# Patient Record
Sex: Male | Born: 1955 | Race: White | Hispanic: No | Marital: Married | Smoking: Never smoker
Health system: Southern US, Community
[De-identification: ages and names within clinical notes are randomized; demographics above are authoritative.]

## PROBLEM LIST (undated history)

## (undated) DIAGNOSIS — R011 Cardiac murmur, unspecified: Secondary | ICD-10-CM

## (undated) DIAGNOSIS — K219 Gastro-esophageal reflux disease without esophagitis: Secondary | ICD-10-CM

## (undated) DIAGNOSIS — E785 Hyperlipidemia, unspecified: Secondary | ICD-10-CM

## (undated) DIAGNOSIS — R0683 Snoring: Secondary | ICD-10-CM

## (undated) DIAGNOSIS — Z9889 Other specified postprocedural states: Secondary | ICD-10-CM

## (undated) DIAGNOSIS — G4730 Sleep apnea, unspecified: Secondary | ICD-10-CM

## (undated) DIAGNOSIS — M1990 Unspecified osteoarthritis, unspecified site: Secondary | ICD-10-CM

## (undated) DIAGNOSIS — I10 Essential (primary) hypertension: Secondary | ICD-10-CM

## (undated) DIAGNOSIS — R112 Nausea with vomiting, unspecified: Secondary | ICD-10-CM

## (undated) DIAGNOSIS — I2510 Atherosclerotic heart disease of native coronary artery without angina pectoris: Secondary | ICD-10-CM

## (undated) HISTORY — PX: APPENDECTOMY: SHX54

## (undated) HISTORY — PX: TRIGGER FINGER RELEASE: SHX641

## (undated) HISTORY — PX: SHOULDER ARTHROSCOPY: SHX128

## (undated) HISTORY — PX: I&D EXTREMITY: SHX5045

---

## 2000-04-30 ENCOUNTER — Ambulatory Visit (HOSPITAL_COMMUNITY): Admission: RE | Admit: 2000-04-30 | Discharge: 2000-04-30 | Payer: Self-pay | Admitting: Gastroenterology

## 2000-04-30 ENCOUNTER — Encounter (INDEPENDENT_AMBULATORY_CARE_PROVIDER_SITE_OTHER): Payer: Self-pay | Admitting: Specialist

## 2001-05-14 ENCOUNTER — Encounter: Admission: RE | Admit: 2001-05-14 | Discharge: 2001-05-14 | Payer: Self-pay | Admitting: Family Medicine

## 2004-10-30 ENCOUNTER — Ambulatory Visit: Payer: Self-pay | Admitting: Family Medicine

## 2004-11-24 HISTORY — PX: CORONARY ARTERY BYPASS GRAFT: SHX141

## 2005-04-22 ENCOUNTER — Inpatient Hospital Stay (HOSPITAL_COMMUNITY): Admission: AD | Admit: 2005-04-22 | Discharge: 2005-04-29 | Payer: Self-pay | Admitting: *Deleted

## 2005-04-24 ENCOUNTER — Encounter (INDEPENDENT_AMBULATORY_CARE_PROVIDER_SITE_OTHER): Payer: Self-pay | Admitting: *Deleted

## 2005-05-21 ENCOUNTER — Encounter: Admission: RE | Admit: 2005-05-21 | Discharge: 2005-05-21 | Payer: Self-pay | Admitting: *Deleted

## 2005-06-02 ENCOUNTER — Encounter (HOSPITAL_COMMUNITY): Admission: RE | Admit: 2005-06-02 | Discharge: 2005-08-31 | Payer: Self-pay | Admitting: *Deleted

## 2005-06-20 ENCOUNTER — Ambulatory Visit (HOSPITAL_COMMUNITY): Admission: RE | Admit: 2005-06-20 | Discharge: 2005-06-20 | Payer: Self-pay | Admitting: *Deleted

## 2005-09-05 ENCOUNTER — Encounter (HOSPITAL_COMMUNITY): Admission: RE | Admit: 2005-09-05 | Discharge: 2005-09-25 | Payer: Self-pay | Admitting: *Deleted

## 2006-06-14 IMAGING — CR DG CHEST 2V
2 series · 2 of 2 positions shown · non-contrast
Comparison: None.

CLINICAL DATA: Chest pain.
 [id number] VIEW:

[w chest lat]
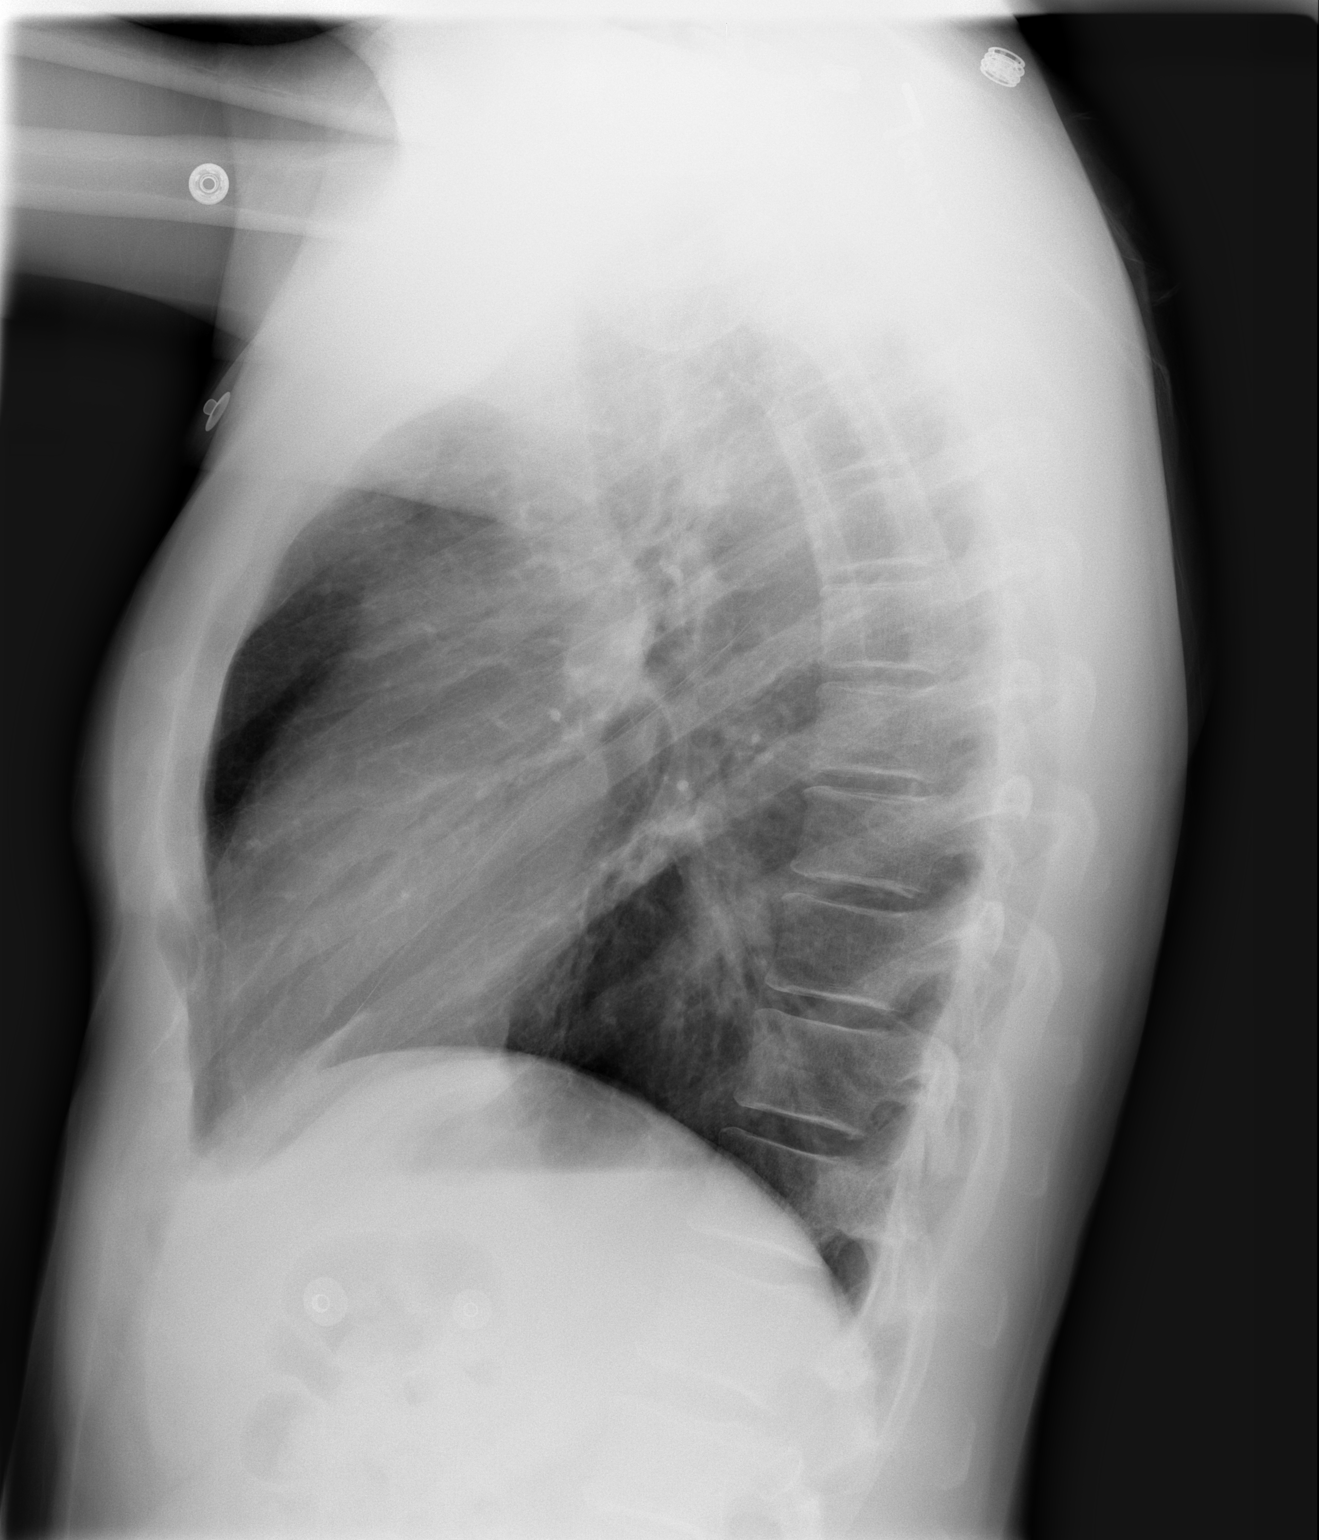

[w chest pa]
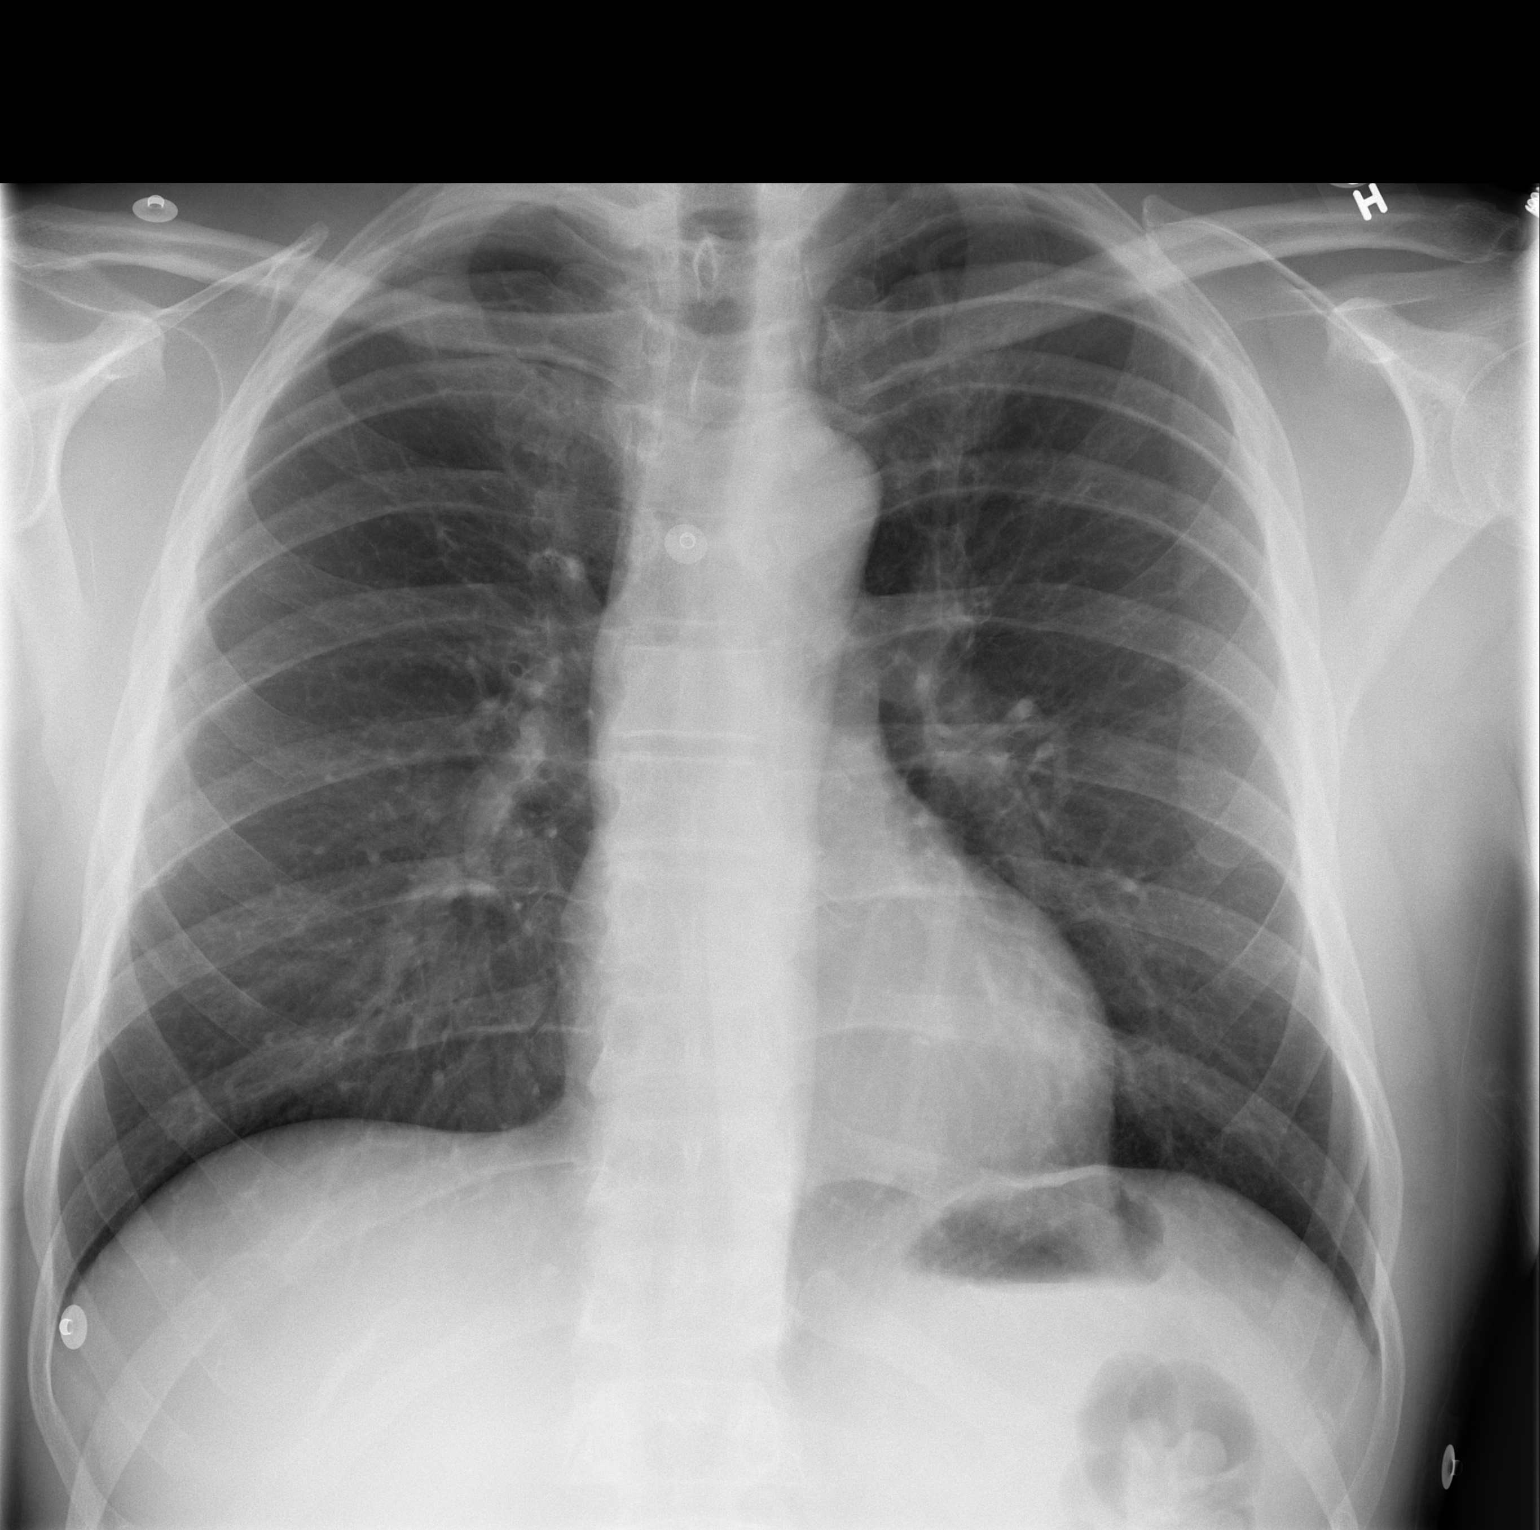

[2 of 2 positions shown; findings below may reference images not displayed]

The lungs are clear.  Cardiac and mediastinal contours are normal.  Osseous structures unremarkable.
IMPRESSION: Negative for acute cardiac or pulmonary process.

## 2006-06-17 IMAGING — CR DG CHEST 1V PORT
1 series · 1 of 1 positions shown · non-contrast
Comparison: 04/25/05.

CLINICAL DATA: Coronary artery disease, postop from coronary artery bypass grafting. 
 PORTABLE CHEST:

[view not recorded]
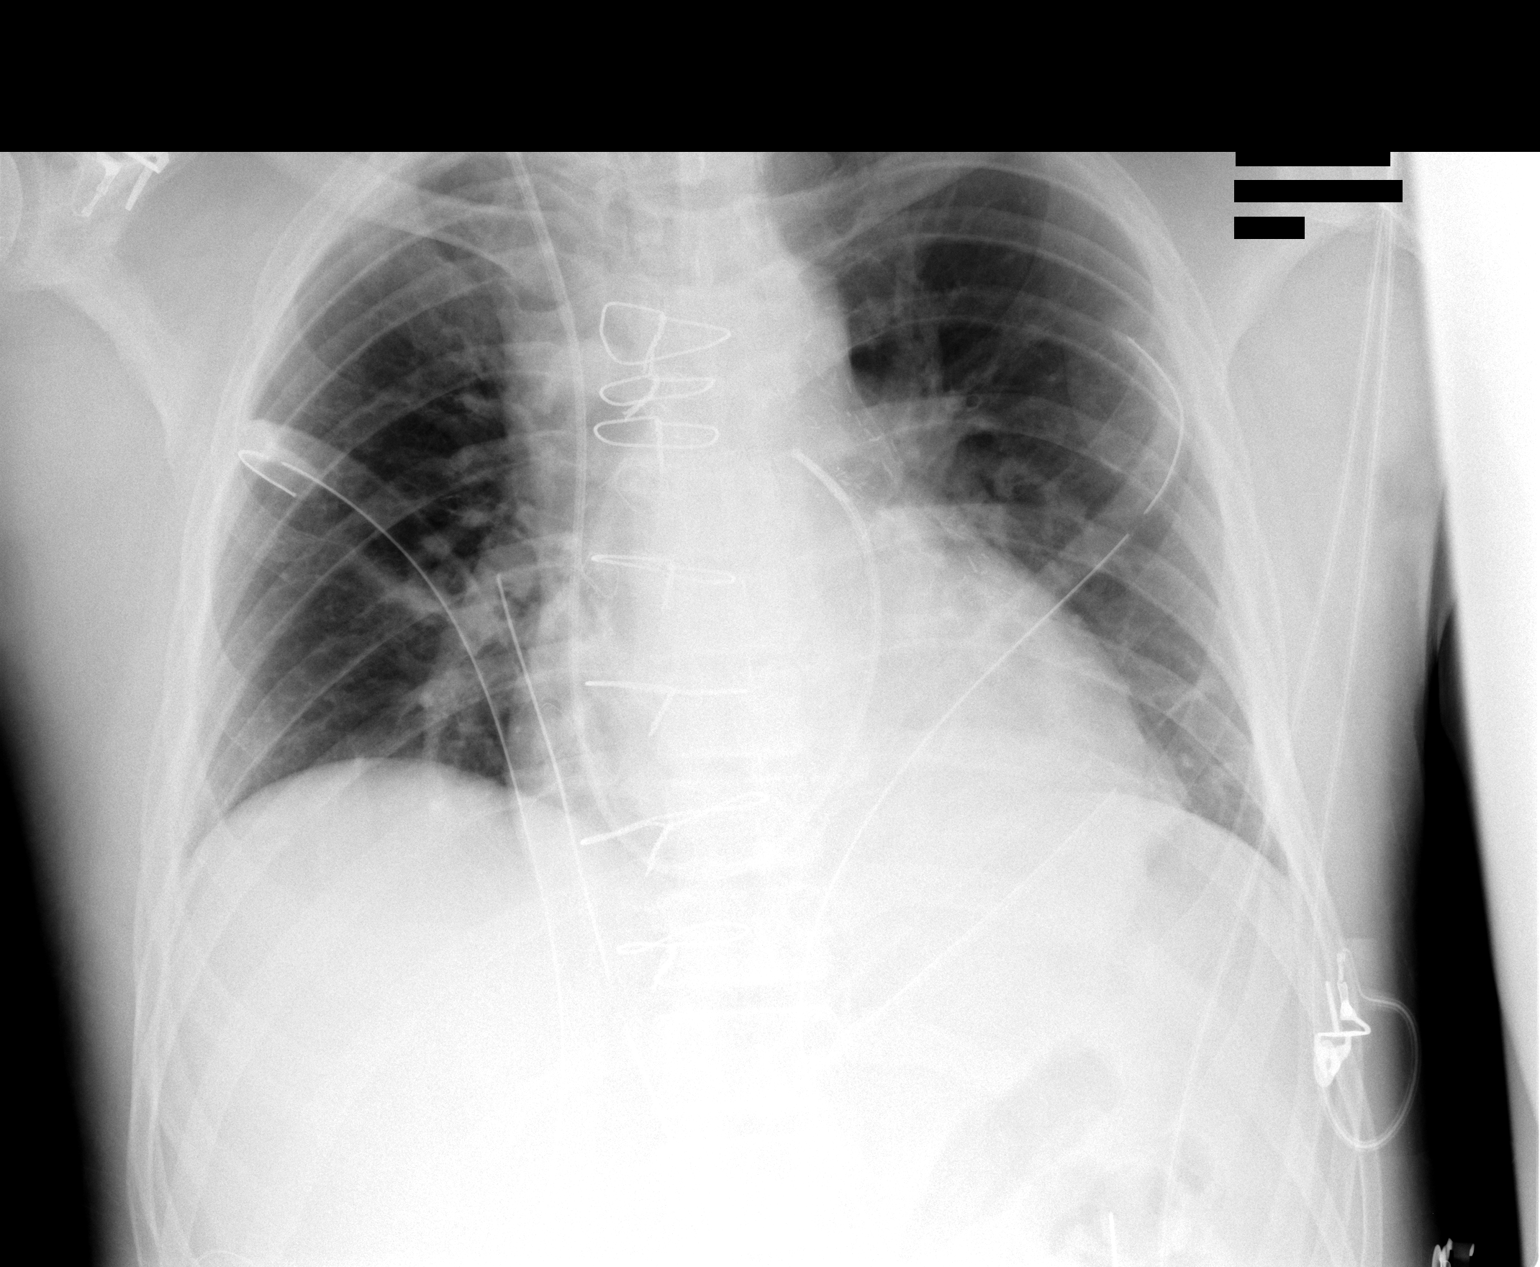

[1 of 1 positions shown; findings below may reference images not displayed]

Endotracheal tube and nasogastric tube have been removed since the previous study. Left lower lobe atelectasis is again seen and without significant change. No pneumothorax is identified.  Other support lines and tubes remain in place.
IMPRESSION: Left lower lobe atelectasis without significant change.

## 2006-06-18 IMAGING — CR DG CHEST 1V PORT
1 series · 1 of 1 positions shown · non-contrast
Comparison: none

CLINICAL DATA: CABG follow-up. 
 PORTABLE CHEST ? 1 VIEW, 04/27/05 AT 8019 HOURS:
 Compared to a portable film from earlier today, bilateral chest tubes have been removed.  There may be a very tiny right apical pneumothorax present, but no significant pneumothorax is seen.  Mild basilar atelectasis remains.  Venous sheath remains in the SVC.

[view not recorded]
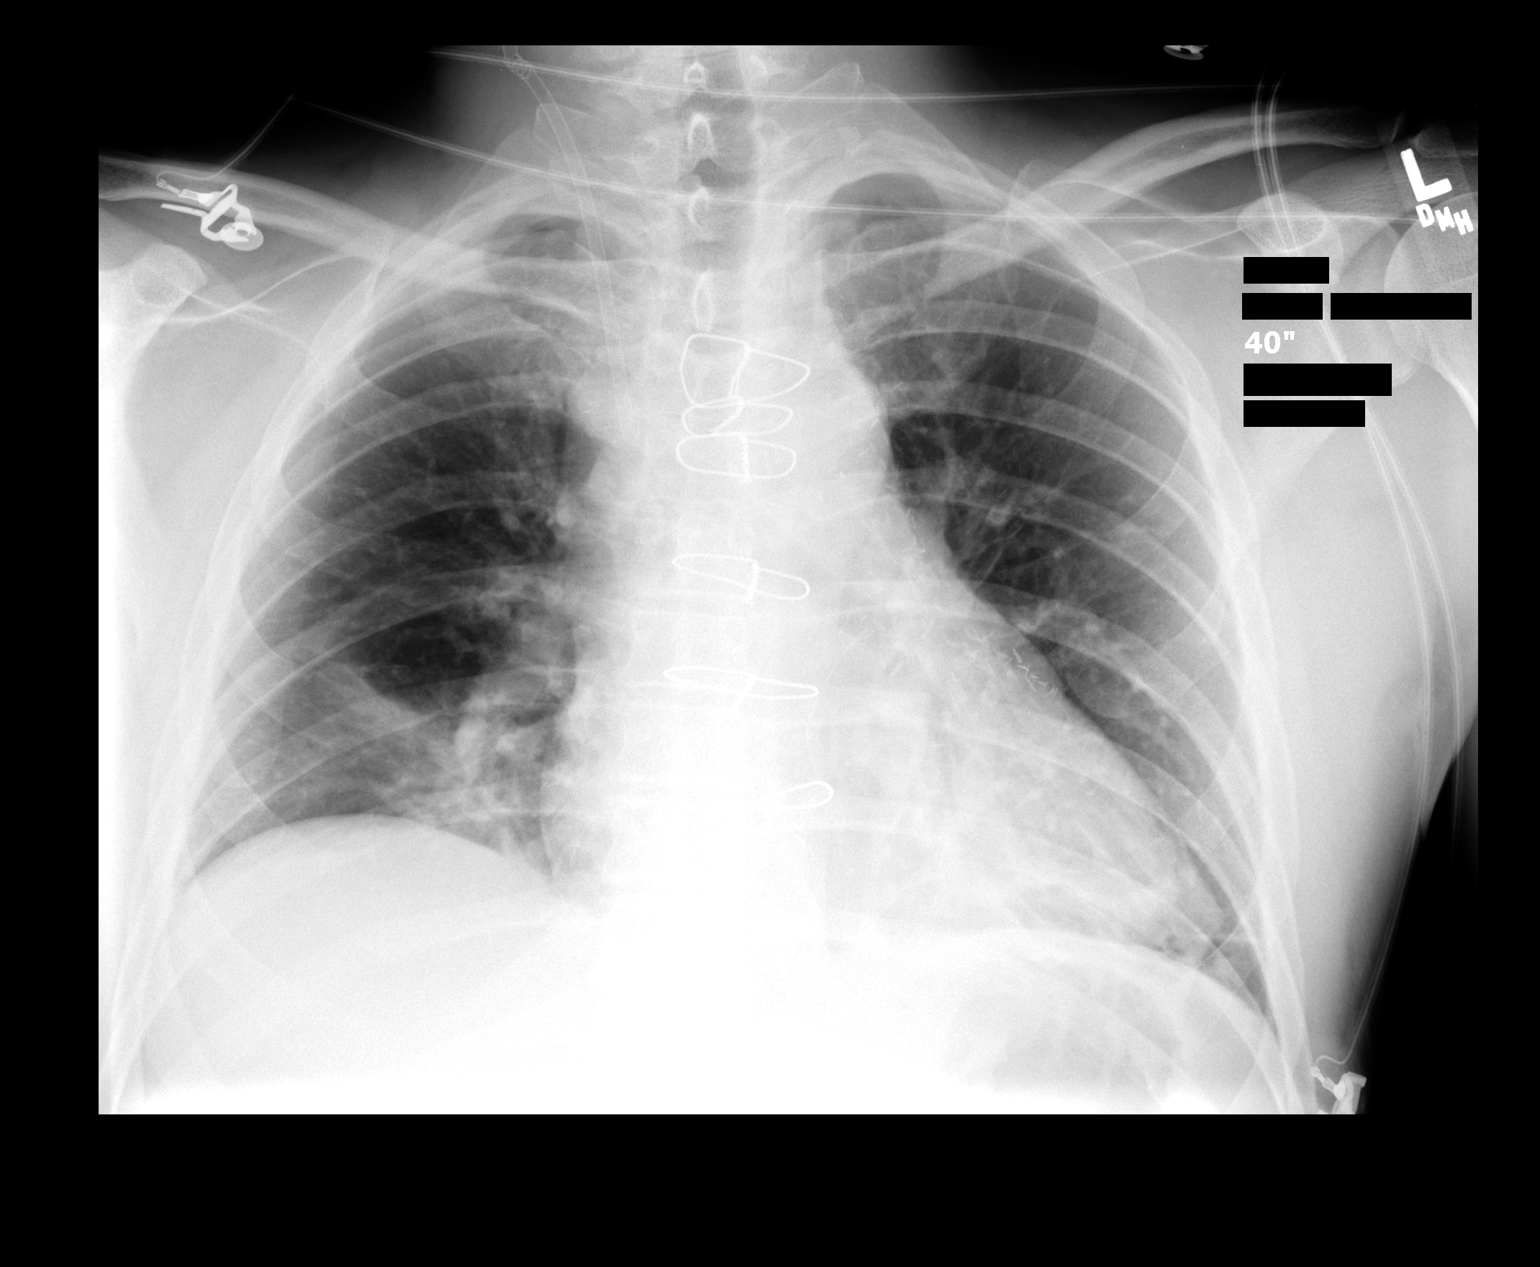

[1 of 1 positions shown; findings below may reference images not displayed]

IMPRESSION: Bilateral chest tubes have been removed.  There is a question of a tiny right apical pneumothorax.

## 2006-07-12 IMAGING — US US ABDOMEN COMPLETE
1 series · 14 of 25 positions shown · non-contrast
Comparison: None

CLINICAL DATA: Right upper quadrant abdominal pain.

COMPLETE ABDOMINAL ULTRASOUND  05/21/2005:

[Series 1: unknown · 0.31mm/px · 14 of 72 slices shown]
[im 1/72]
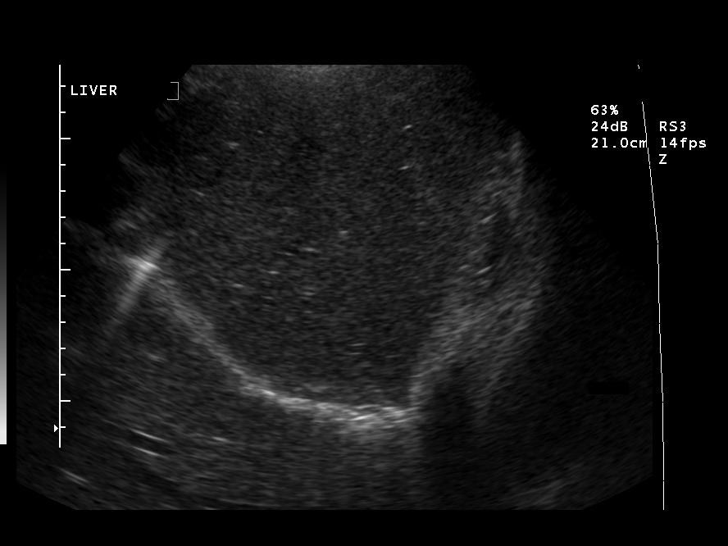
[im 6/72]
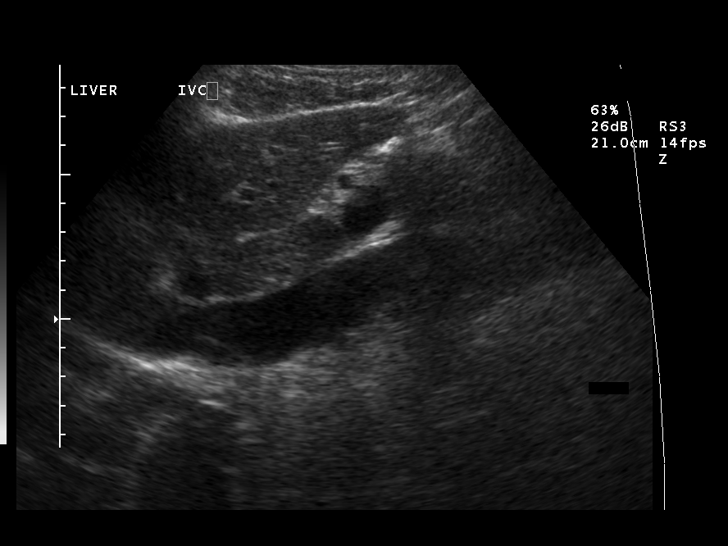
[im 12/72]
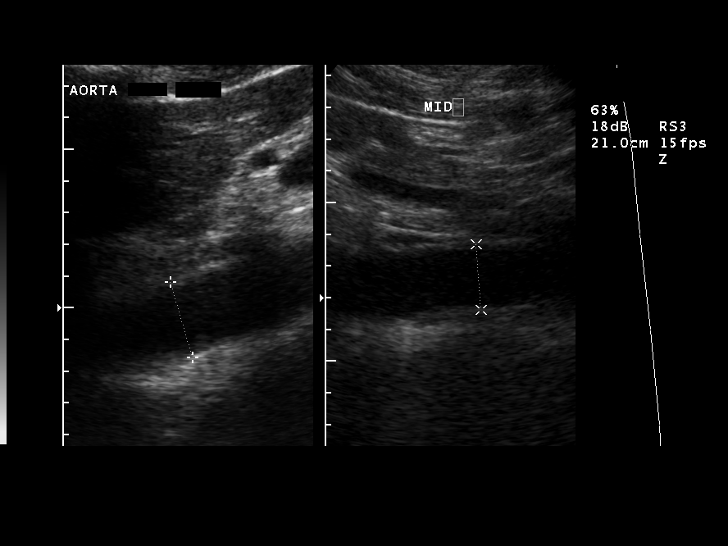
[im 18/72]
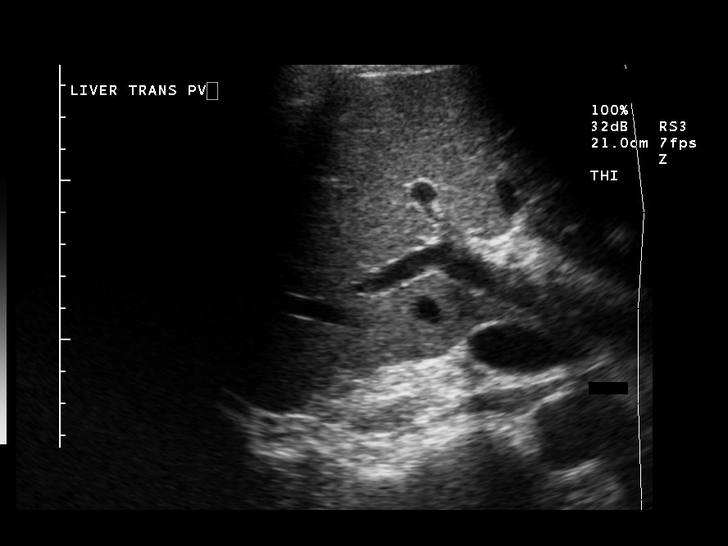
[im 24/72]
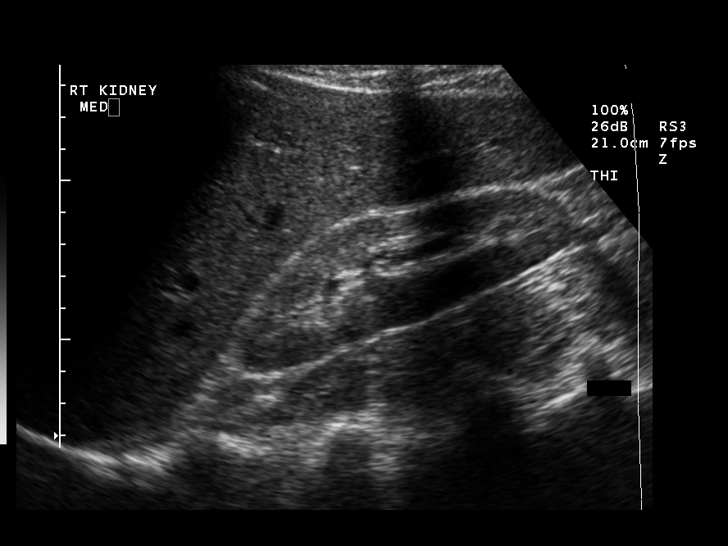
[im 27/72]
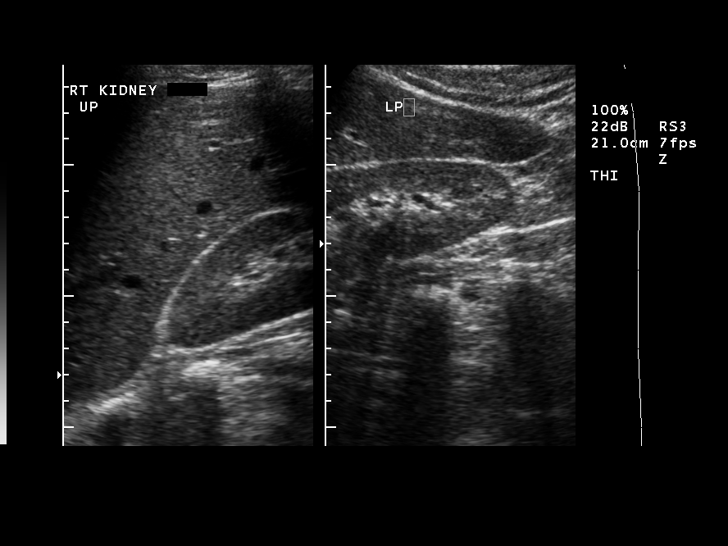
[im 33/72]
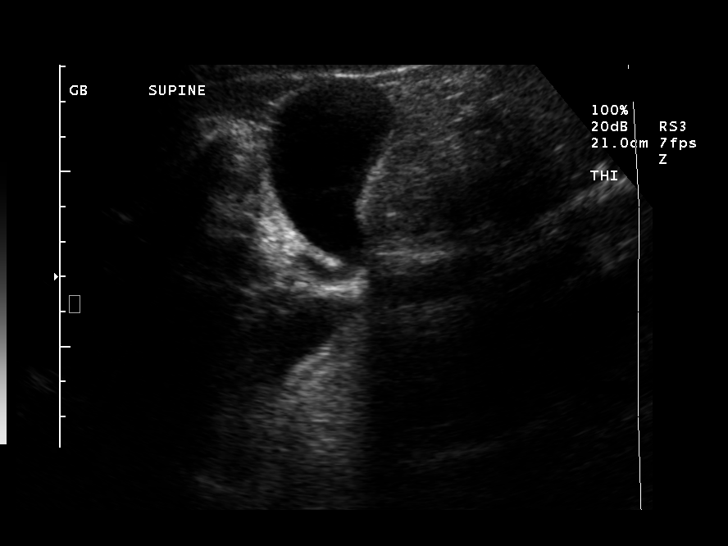
[im 39/72]
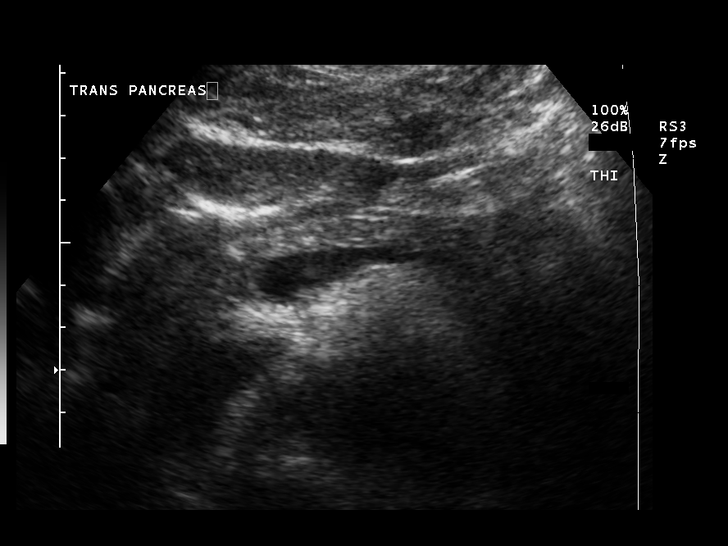
[im 45/72]
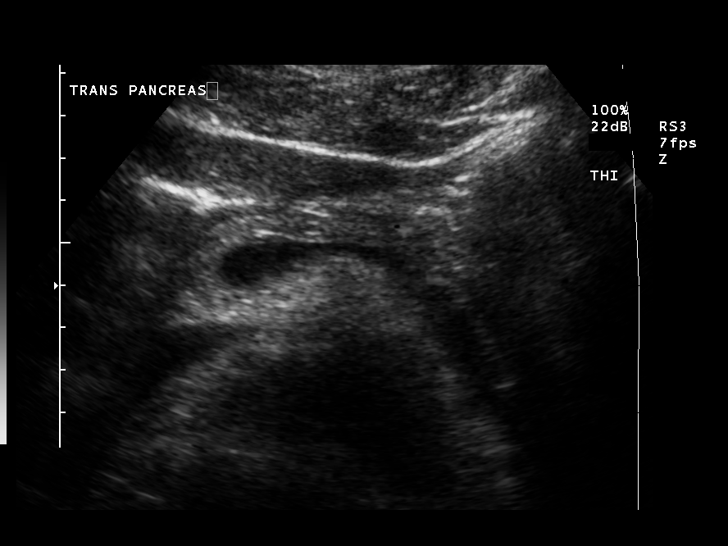
[im 48/72]
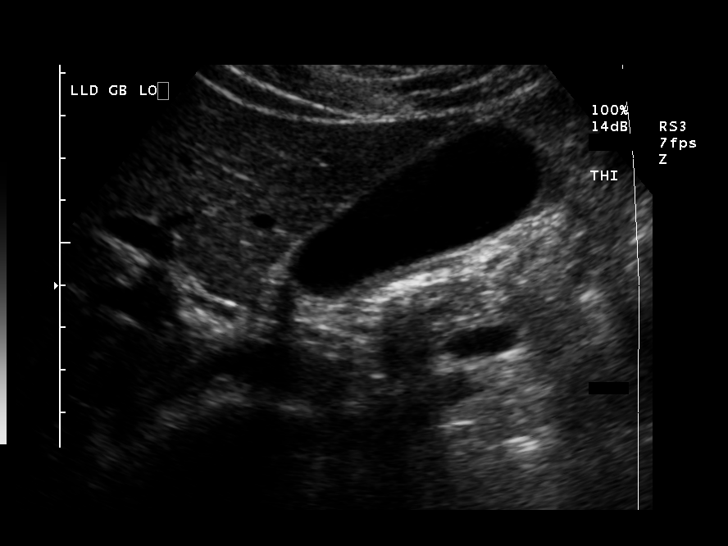
[im 54/72]
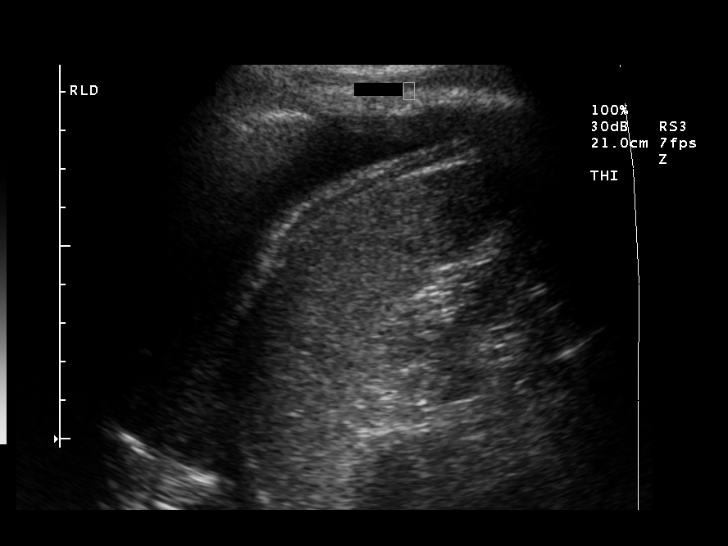
[im 60/72]
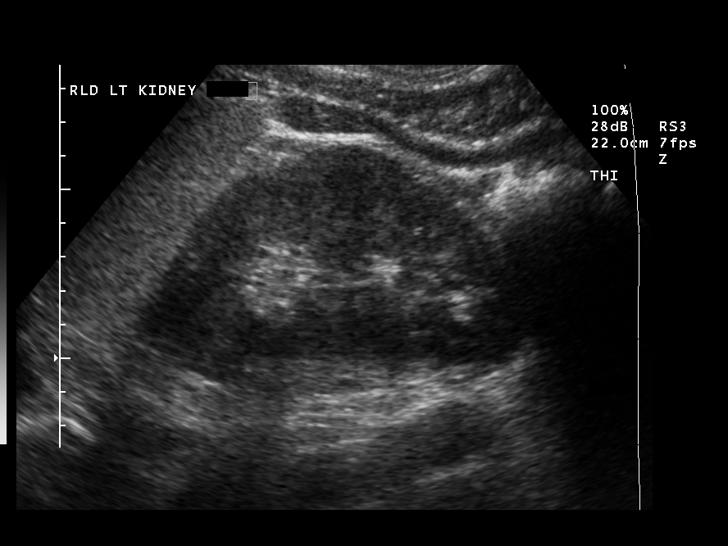
[im 66/72]
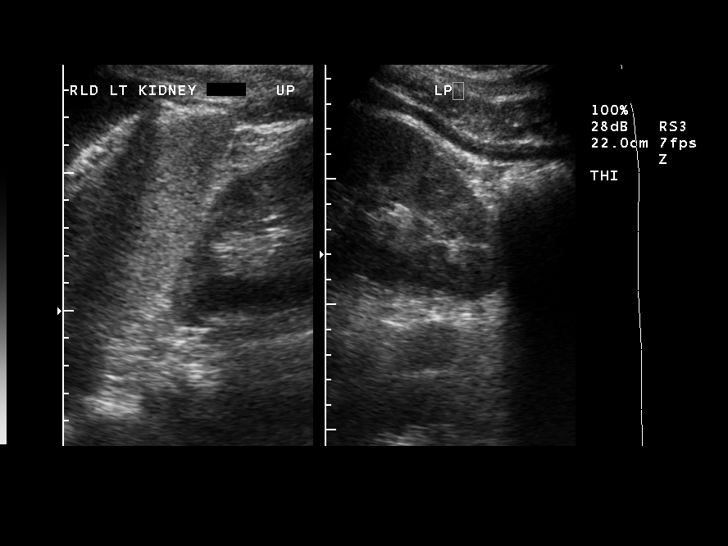
[im 72/72]
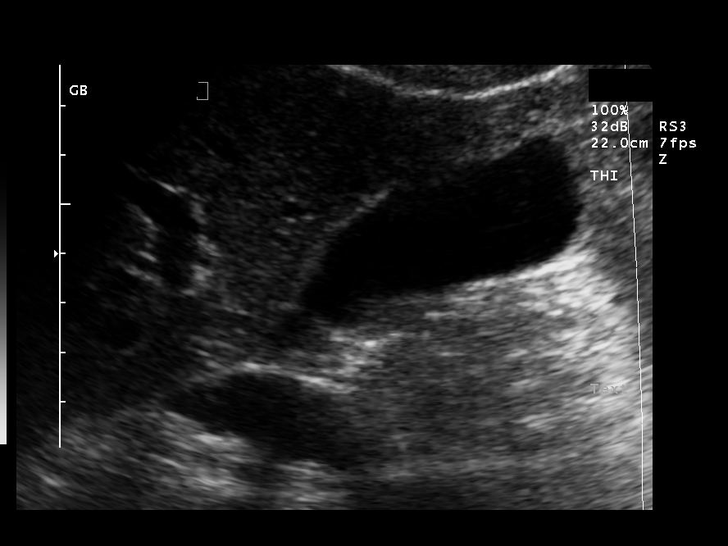

[14 of 25 positions shown; findings below may reference images not displayed]

FINDINGS: Gallbladder:  Normal.

Common bile duct: Normal, 4 mm diameter.

Liver:  Normal.

Inferior vena cava:  Patent.

Pancreas:  Normal.

Spleen:  Normal, 11.2 cm.

Right kidney:  Normal, 12.0 cm.

Left kidney:  Normal, 11.8 cm.

Abdominal aorta:  Normal, maximum diameter 2.57 meters.

Other: Small left pleural effusion noted.
IMPRESSION: 1. Normal abdominal ultrasound.
2. Small left pleural effusion.

## 2008-10-01 ENCOUNTER — Encounter: Admission: RE | Admit: 2008-10-01 | Discharge: 2008-10-01 | Payer: Self-pay | Admitting: Orthopedic Surgery

## 2008-12-26 ENCOUNTER — Encounter: Admission: RE | Admit: 2008-12-26 | Discharge: 2009-03-26 | Payer: Self-pay | Admitting: Orthopedic Surgery

## 2011-04-11 NOTE — Procedures (Signed)
[facility]  Patient:    [name], [name]                   MRN: [id number] Proc. Date: 04/30/00 Adm. Date:  [id number] Disc. Date: [id number] Attending:  Orland Mustard CC:         Winn Jock. Charmian Muff, M.D.                           Procedure Report  NAME OF PROCEDURE:  Colonoscopy.  ENDOSCOPIST:  Llana Aliment. Randa Evens, M.D.  MEDICATIONS:  The patient received a total of fentanyl 150 mcg and Versed 12 mg for both procedures.  INDICATIONS:  Strong family history for colon cancer.  He had an endoscopy just  before this procedure.  SCOPE:  Adult Olympus video colonoscope.  DESCRIPTION OF PROCEDURE:  The procedure has been explained to the patient and consent obtained.  With the patient in the left lateral decubitus position the adult Olympus colonoscope was inserted and advanced under direct visualization.  We advanced around to the cecum without difficulty; right lower quadrant and ileocecal valve seen.  The scope was withdrawn.  The cecum, ascending colon, hepatic flexure and transverse course seen well.  In the distal transverse colon a 0.5 cm sessile polyp was encountered.  It was cauterized with a hot biopsy forceps.  The descending nd sigmoid colon were were seen well; no significant diverticular disease and no other polyps.  The rectum was also carefully examined and was normal.  No evidence of  polyps.  The patient tolerated the procedure well.  The patient was maintained n low-flow oxygen and pulse oximeter throughout the procedure with no obvious problem.  ASSESSMENT:  Transverse colon polyp cauterized in a gentleman with a strong family history of colon cancer.  PLAN:  We will check pathology and depending upon the results we will make recommendation about followup schedule. DD:  04/30/00 TD:  05/04/00 Job: [id number]

## 2011-04-11 NOTE — Cardiovascular Report (Signed)
NAMELIONELL, MATUSZAK NO.:  [id number]   MEDICAL RECORD NO.:  [id number]          PATIENT TYPE:  OIB   LOCATION:  2899                         FACILITY:  [facility]   PHYSICIAN:  Meade Maw, M.D.    DATE OF BIRTH:  09-12-1956   DATE OF PROCEDURE:  06/20/2005  DATE OF DISCHARGE:  06/20/2005                              CARDIAC CATHETERIZATION   REFERRING PHYSICIAN:  Joycelyn Rua, M.D.   INDICATION FOR PROCEDURE:  Fatigue, chest discomfort, acute in onset, status  post coronary artery bypass surgery in May of '06.   PROCEDURE:  After obtaining written informed consent, the patient was  brought to the cardiac catheterization lab in a postabsorptive state.  Preop  sedation was achieved using IV Versed.  The right groin was prepped and  draped in the usual sterile fashion.  Local anesthesia was achieved using 1%  Xylocaine.  A 6-French hemostasis sheath was placed into the right femoral  artery using a modified Seldinger technique.  Selective coronary angiography  was performed using JL4, a JR4, an IMA catheter and a right coronary artery  bypass catheter.  Single-plane ventriculogram was performed in the RAO  position using a 6-French pigtail curved catheter.  All catheter exchanges  were made over a guidewire and the hemostasis sheath was flushed following  each catheter exchange.  There were no immediate complications.  The films  were reviewed with Dr. Peter Swaziland for a second opinion.  The patient was  transferred to the holding area.  The hemostasis sheath was removed.  Hemostasis was achieved using digital pressure.   FINDINGS:  The aortic pressure was 134/72.  LV pressure was 136/8.  The  single-plane ventriculogram revealed normal wall motion with an ejection  fraction of 65%.  There was post-PVC MR only.   CORONARY ANGIOGRAPHY:  The left main coronary artery trifurcates into the  left anterior descending, circumflex and ramus vessel.  There is no  significant disease in the left main coronary artery.   Left anterior descending:  The left anterior descending gives rise to a  small diagonal; it is then 100% occluded.  The circumflex vessel gives rise  to a early OM-1 and it then ends as a small lateral branch.  There is a 100%  mid -vessel occlusion of the circumflex.   Right coronary artery:  Right coronary artery gives rise to a RV marginal;  it is then 100% occluded.  The saphenous vein graft to the ramus vessel  remains patent.  The left radial graft to the obtuse marginal is a large  graft.  There is questionably an ostial occlusion.  Two hundred micrograms  of nitroglycerin were given without significant improvement.  The size of  the graft is approximately the same size of the 6-French catheter.  A JR4  and non-torque right were also attempted to further identify the lesion;  this was to no avail.  The saphenous vein graft to the PDA remains patent  with good distal runoff.   The left internal mammary artery remains patent with good distal runoff.  The right internal mammary artery remains patent with  good distal runoff.   FINAL IMPRESSION:  1.  Questionable ostial occlusion in the radial graft to the obtuse      marginal.  He will need a stress Cardiolite for      further evaluation of this.  2.  Normal single-plane ventriculogram with an ejection fraction of 60%.      The films were reviewed with Dr. Peter Swaziland.  Further intervention is      pending the outcome of the stress Cardiolite.      Meade Maw, M.D.  Electronically Signed     HP/MEDQ  D:  06/20/2005  T:  06/21/2005  Job:  [id number]

## 2011-04-11 NOTE — Op Note (Signed)
[name], [name]            ACCOUNT NO.:  [id number]   MEDICAL RECORD NO.:  [id number]          PATIENT TYPE:  INP   LOCATION:  2306                         FACILITY:  [facility]   PHYSICIAN:  Evelene Croon, M.D.     DATE OF BIRTH:  [birth year]   DATE OF PROCEDURE:  04/25/2005  DATE OF DISCHARGE:                                 OPERATIVE REPORT   PREOPERATIVE DIAGNOSIS:  Left main and three-vessel coronary artery disease  with unstable angina.   POSTOPERATIVE DIAGNOSIS:  Left main and three-vessel coronary artery disease  with unstable angina.   OPERATIVE PROCEDURE:  1.  Median sternotomy.  2.  Extracorporeal circulation.  3.  Coronary artery bypass graft surgery x5 using a left internal mammary      artery graft to the diagonal branch of the LAD, a right internal mammary      artery graft to the left anterior descending coronary artery, a left      radial artery graft to the obtuse marginal branch of the left circumflex      coronary artery, and saphenous vein grafts to the posterior descending      coronary artery and the intermediate coronary artery.  4.  Endoscopic vein harvesting from the right thigh.   ATTENDING SURGEON:  Evelene Croon, MD.   ASSISTANTStephanie Acre. Dominick, PA-C.   ANESTHESIA:  General endotracheal.   CLINICAL HISTORY:  This patient is a 55 year old gentleman with no prior  cardiac history, who presented with a five to six week history of exertional  chest pressure.  His symptoms progressed to the point where he was having  chest pain while walking to his car from work.  He had a stress test that  showed inferior ischemia.  Cardiac catheterization by Dr. Fraser Din showed an  ulcerated distal left main plaque.  The LAD had 60-70% mid vessel stenosis.  There was a large first diagonal that had 60-70% ostial stenosis.  The left  circumflex had proximal diffuse disease and about 70% mid vessel stenosis.  The intermediate had about 60% ostial stenosis.  The  right coronary artery  was dominant with a 90% mid vessel stenosis.  Left ventricular ejection  fraction of about 60% with normal wall motion.  After review of the  angiogram and examination of the patient, it was felt that coronary artery  bypass graft surgery was the best treatment to prevent further ischemia and  infarction.  I discussed the operative procedure with the patient including  alternatives, benefits, and risks including bleeding, blood transfusion,  infection, stroke, myocardial infarction, graft failure, and death.  We also  discussed the use of bilateral internal mammary artery grafts and the use of  left radial artery graft.  His preoperative arterial Dopplers showed both  hands were ulnar dependent.   OPERATIVE PROCEDURE:  The patient was taken to the operating room and placed  on the table in the supine position.  After induction of general  endotracheal anesthesia, a Foley catheter was placed in the bladder using a  sterile technique.  Then the left arm was positioned out at the side  on an  arm board.  The chest, abdomen, and both lower extremities as well as the  left arm were prepped and draped in the usual sterile manner.  The left  radial artery harvest was performed first.  The radial artery was harvested  through a longitudinal incision.  Care was taken to preserve the cutaneous  nerve.  This was a medium-sized artery with excellent pulsation in it.  The  artery was temporarily occluded distally near the wrist with an atraumatic  vascular clamp, and there was a Doppler signal in the artery beyond the  clamp.  Therefore, I felt it was safe to harvest this artery.  It was  harvested by dividing the branches using an harmonic scalpel.  It was  harvested as a pedicle.  The radial artery was ligated proximally and  distally using a silk suture and divided.  There was slush with the Paverine-  saline solution.  All the side branches were then clipped.   Then the left  internal mammary was harvested from the chest wall as a  pedicle graft.  This was a medium caliber vessel with excellent blood flow  through it.  This was followed by harvesting of the right internal mammary  artery as a pedicle graft.  This was also a medium caliber vessel with  excellent blood flow through it.  Then a 7 mm greater saphenous vein was  harvested from the right thigh using endoscopic vein harvest technique.  This vein was of medium to large caliber and of good quality.   Then the patient was heparinized, and when an adequate active clotting was  achieved the distal ascending aorta was cannulated using a 20-French aortic  cannula for arterial inflow.  Venous outflow was achieved using a two-stage  venous cannula for the right atrial appendage.  An antegrade cardioplegia  and vent cannula was inserted in the aortic root.   The patient was placed on cardiopulmonary bypass and the distal coronary was  identified.  The LAD was a medium-sized vessel.  This was actually smaller  than the diagonal branch, which was a large, graftable vessel.  Both of  these vessels appeared free of disease distally.  The intermediate had two  sub branches.  The first one was larger than the second one and was felt to  be graftable.  The obtuse marginal was a large caliber graftable vessel.  The right coronary artery surprisingly was diffusely diseased throughout  it's mid and distal portion.  There was a concentric plaque in the right  coronary artery extending from the area where the 90% stenosis was noted on  angiogram distally to the posterior descending branch.  There was also some  plaque in the proximal portion of the posterior descending artery.  I  initially planned on using the right internal mammary graft for the right  coronary artery, but I did not feel comfortable placing the mammary artery graft proximal to this remaining plaque and it would not be long enough to  reach to the mid  posterior descending artery.  Therefore, a vein graft was  used to the right coronary artery.   Then the aorta was cross clamped and 500 mL of cold blood antegrade  cardioplegia were administered in the aortic root with quick arrest of the  heart.  Systemic hypothermia to 28 degrees Centigrade and topical  hypothermic iced saline was used.  A temperature probe was placed in the  septum and sliding down the pericardium.  The first distal anastomosis was then performed to the obtuse marginal  branch.  The internal diameter was about 1.75 mm.  The conduit used was a  left radial artery graft and this was anastomosed in an end-to-side manner  using a continuous 8.0 Prolene suture.  Flow was administered to the graft  and was excellent.   The second distal anastomosis was performed to the posterior descending  coronary artery.  The internal diameter of this vessel was about 1.75 mm.  The conduit used was a segment of greater saphenous vein.  The anastomosis  was performed in an end-to-side manner using a continuous 7-0 Prolene  suture.  Flow was administered through the graft and was excellent.  Then  another dose of cardioplegia was given down the vein graft and the aortic  root.   A third distal anastomosis was performed of the intermediate coronary  artery.  The internal diameter was about 1.6 mm.  The conduit used was a  second segment of saphenous vein.  The anastomosis was performed in an end-  to-side manner using a continuous 7-0 Prolene suture.  Flow was administered  through the graft and was excellent.   The fourth distal anastomosis was performed to the diagonal branch of the  LAD.  The internal diameter of this vessel was about 2 mm.  The conduit used  was the left internal mammary graft through a posterior opening in the left  pericardium anterior to the phrenic nerve.  It was anastomosed to the  diagonal branch in an end-to-side manner using a continuous 8-0 Prolene   suture.  The pedicle was sutured to the epicardium with 6-0 Prolene sutures.  Another dose of cardioplegia was given into the aortic root and down the  vein grafts.   The fifth distal anastomosis was performed to the left anterior descending  coronary artery.  The internal diameter of this vessel was about 1.75 mm.  The conduit used was the right internal mammary graft and this was brought  across the distal ascending aorta and anastomosed to the LAD in an end-to-  side manner using a continuous 8-0 Prolene suture.  The pedicle was again  sutured to the epicardium with 6-0 Prolene sutures.  Then the patient was  rewarmed to 37 degrees Centigrade.  The two proximal vein graft anastomoses  were then performed to the aortic root in an end-to-side manner using a  continuous 6-0 Prolene suture.  Since the aortic wall was normal, the  proximal end of the radial artery graft was anastomosed directly to the aorta in an end-to-side manner using a continuous 7-0 Prolene suture.  Then  the clamp was removed from the mammary pedicle.  There was rapid warming of  the ventricular septum and return of spontaneous ventricular fibrillation.  The cross clamp was removed, with a time of 109 minutes, and the patient  spontaneously converted to sinus rhythm.  The proximal and distal  anastomoses appeared hemostatic and the line of the graft satisfactory.  Graft markers were placed around the proximal anastomoses.  Two temporary  right ventricular and right atrial pacing wires were placed and brought out  through the skin.   When the patient had rewarmed to 37 degrees Centigrade, he was weaned from  cardiopulmonary bypass with no evidence of complications.  Total bypass time  was 131 minutes.  Cardiac function appeared excellent with cardiac output of  five liters a minute.  Protamine was given and the venous and aortic  cannulae  were removed without difficulty.  Hemostasis was achieved.  Four  chest tubes  were placed with bilateral pleural tubes, a tube in the  posterior pericardium and one in the anterior mediastinum.  The pericardium  was closed loosely over the heart.  The sternum was closed with #6 stainless  steel wires.  The fascia was closed with a continuous #1 Vicryl suture.  The  subcutaneous tissue was closed with continuous 2-0 Vicryl, and the skin with  a 3-0 Vicryl subcuticular closure.  The lower extremity vein harvest site  was closed in layers in a similar manner.  The left radial harvest incision  was closed in layers in a similar manner.  The sponge, needle, and  instrument counts were correct according to the scrub nurse.  Dry sterile  dressings were applied over the incisions and around the chest tubes, which  were hooked to Pleurovac suction.  The patient remained hemodynamically  stable and was transported to the SICU in guarded but stable condition.       BB/MEDQ  D:  04/25/2005  T:  04/26/2005  Job:  [id number]   cc:   Meade Maw, M.D.  [address]  [place]  [place] [address]  Fax: [phone]   Cardiac Cath Lab   [facility]

## 2011-04-11 NOTE — Consult Note (Signed)
[name], [name]            ACCOUNT NO.:  [id number]   MEDICAL RECORD NO.:  [id number]          PATIENT TYPE:  INP   LOCATION:  2908                         FACILITY:  [facility]   PHYSICIAN:  Evelene Croon, M.D.     DATE OF BIRTH:  Apr 13, 1956   DATE OF CONSULTATION:  04/23/2005  DATE OF DISCHARGE:                                   CONSULTATION   REFERRING PHYSICIAN:  Dr. Meade Maw.   REASON FOR CONSULTATION:  Left main and severe three-vessel coronary artery  disease.   HISTORY OF PRESENT ILLNESS:  The patient is a 55 year old gentleman who was  previously healthy and very athletically active, who presented with a 5- to  6-week history of exertional chest pressure with occasional radiation into  the base of his neck and between his shoulder blades.  This has been  occurring with less exertion.  The patient is very active and rides his  bicycle for 20 miles at a time and runs several times per week.  He has had  some chest pain with these activities, but has continued to do them.  About  2 days prior to admission, he had chest pain while walking from his car into  work and he knew that this was not normal.  He underwent a stress test on  Apr 22, 2005 which showed significant inferior ischemia.  He was admitted  for elective cardiac catheterization today.  This showed ulcerated plaque in  the distal left main coronary artery.  The LAD had 60% to 70% mid-vessel  stenosis.  There was a large first diagonal that had 60% to 70% ostial  stenosis.  The left circumflex had diffuse proximal 30% to 40% stenosis and  about 70% mid-vessel stenosis.  There was about 60% ostial ramus stenosis.  The right coronary artery was a dominant vessel with 90% mid-vessel  stenosis.  Left ventricular ejection fraction was about 60% with no  significant gradient across the aortic valve.   REVIEW OF SYSTEMS:  GENERAL:  He denies any fever or chills.  He has had  mild fatigue.  EYES:  Negative.  ENT:   Negative.  ENDOCRINE:  He denies  diabetes and hypothyroidism.  CARDIOVASCULAR:  As above.  He denies  exertional shortness of breath.  He has had no palpitations.  He denies PND  or orthopnea.  He has had no rest pain.  RESPIRATORY:  He denies cough and  sputum production.  GI:  He has had no nausea or vomiting.  He has  occasional heartburn symptoms.  He denies melena and bright red blood per  rectum.  GU:  He denies dysuria and hematuria.  MUSCULOSKELETAL:  No  arthralgias or myalgias.  NEUROLOGIC:  He denies any focal weakness or  numbness.  He denies dizziness and syncope.  ALLERGIES:  None.  HEMATOLOGICAL:  Negative.  PSYCHIATRIC:  Negative.   PAST MEDICAL HISTORY:  Past medical history is significant for dyslipidemia.  He says that he has had high triglycerides at times as well as a low HDL.   MEDICATIONS:  He is on no medications.   PAST  SURGICAL HISTORY:  He has had no prior surgery.   SOCIAL HISTORY:  He is married.  He has never smoked and drinks alcohol  occasionally.  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   FAMILY HISTORY:  His father died of colon cancer.  His mother is alive in  her 65s and has hyperlipidemia.   PHYSICAL EXAMINATION:  VITAL SIGNS:  His blood pressure is 125/70.  Pulse is  55 and regular.  Respiratory rate is 16 and unlabored.  He is a muscular,  physically fit white male in no distress.  HEENT:  Exam shows him to be normocephalic and atraumatic.  Pupils equal,  round and reactive to light and accommodation.  Extraocular muscles are  intact.  Throat is clear.  NECK:  Exam shows normal carotid pulses bilaterally.  There are no bruits.  There is no adenopathy or thyromegaly.  CARDIAC:  Exam shows a regular rate and rhythm with a grade 1/6 systolic  murmur along the left sternal border and a grade 1/6 diastolic murmur at the  apex.  LUNGS:  Clear.  ABDOMEN:  Abdominal exam shows active bowel sounds.  His abdomen is soft and   nontender.  There are no palpable masses or organomegaly.  EXTREMITIES:  Extremity exam shows no peripheral edema.  Pedal pulses are  palpable bilaterally.  SKIN:  His skin is warm and dry.  NEUROLOGIC:  Exam shows him to be alert and oriented x3.  Motor and sensory  exams are grossly normal.   LABORATORY AND ACCESSORY CLINICAL DATA:  Electrocardiogram shows sinus  bradycardia with a rate of 52 beats per minute.  There is moderate voltage  for LVH.   IMPRESSION:  Mr. Haris has left main and severe three-vessel coronary  artery disease with high-grade right coronary stenosis and inferior ischemia  on his stress test.  I agree that coronary artery bypass graft surgery is  the best treatment for this patient with ulcerative plaque in his left main  coronary.  He does have a heart murmur and therefore we will obtain an  echocardiogram to evaluate his heart valves.  He will be continued on  heparin and nitroglycerin, and we will plan to do surgery on Friday morning.  I discussed the operative procedure of coronary artery bypass surgery with  him including alternative, benefits, and risks including bleeding, blood  transfusion, infection, stroke, myocardial infarction, graft failure, and  death.  I also discussed the use of bilateral internal mammary arteries and  possibly use of a radial artery, depending on his preoperative arterial  Dopplers.  He understands and agrees to proceed.       BB/MEDQ  D:  04/23/2005  T:  04/24/2005  Job:  [id number]   cc:   Meade Maw, M.D.  [address]  [place]  [place] [address]  Fax: [phone]

## 2011-04-11 NOTE — Discharge Summary (Signed)
NAMEODILON, [name]            ACCOUNT NO.:  [id number]   MEDICAL RECORD NO.:  [id number]          PATIENT TYPE:  INP   LOCATION:  2012                         FACILITY:  [facility]   PHYSICIAN:  Evelene Croon, M.D.     DATE OF BIRTH:  Sep 11, 1956   DATE OF ADMISSION:  04/22/2005  DATE OF DISCHARGE:                                 DISCHARGE SUMMARY   PRIMARY DIAGNOSIS:  Coronary artery disease.   IN-HOSPITAL DIAGNOSIS:  Acute blood loss postoperative anemia.   PAST MEDICAL HISTORY:  Dyslipidemia.   IN-HOSPITAL OPERATIONS AND PROCEDURES:  1.  Cardiac catheterization.  2.  Coronary artery bypass grafting x5 with a left internal mammary artery      graft to the diagonal branch of the LAD, right internal mammary artery      graft to the left anterior descending coronary artery, left radial      artery graft to the obtuse marginal branch of the left circumflex      coronary artery, and saphenous vein graft to the posterior descending      coronary artery and intermediate coronary artery.  Note - endoscopic      vein harvesting from the right thigh was done.   HISTORY AND PHYSICAL AND HOSPITAL COURSE:  [name] is a 55 year old  gentleman who was previously healthy and very athletically active who  presented with a 5-6 week history of exertional chest pressure with  occasional radiation into the base of his neck and between his shoulder  blades.  This has been occurring with less exertion.  The patient is very  active and rides his bicycle for 20 miles at a time, and runs several times  per week.  He has had some chest pain with these activities, but has  continued to do them.  About 2 days prior to admission, he had chest pain  while walking from his car into work, and he knew that this was not normal.  He underwent a stress test on Apr 22, 2005, which showed significant varied  ischemia.  He was admitted for elective cardiac catheterization.  This was  done on Apr 23, 2005.  This  showed ulcerative plaque in the distal left and  main coronary artery.  The LAD had a 60% to 70% mid vessel stenosis.  There  was a large first diagonal that had 60% to 70% osteal stenosis.  The left  circumflex had diffuse proximal 30% to 40% stenosis and about 70% mid vessel  stenosis.  There is about 60% osteal ramus stenosis.  The right coronary  artery was a dominant vessel with 90% mid vessel stenosis.  The left  ventricular ejection fraction was about 60% with no significant gradient  across the aortic valve.  Dr. Laneta Simmers was then consulted.  Dr. Laneta Simmers  discussed and evaluated [name].  He discussed undergoing coronary  artery bypass grafting for these lesions.  He discussed the risks and  benefits of this procedure.  The patient acknowledged understanding and  wished to proceed.  Preoperatively, the patient underwent bilateral carotid  ultrasound which showed a small area of non-calcific  plaque, anterior wall,  distal common carotid artery on the right.  No ICA stenosis noted.  The  patient also underwent bilateral ABIs which showed signal increases with  radial compression and decrease greater than 50% with ulnar compression  bilaterally.   [name] was taken to the operating room on April 25, 2005.  He underwent  coronary artery bypass grafting x5 using a left internal mammary artery  graft to the diagonal branch of the LAD, right internal mammary artery graft  to the left anterior descending coronary artery, left radial artery graft to  the obtuse marginal branch of the left circumflex coronary artery, saphenous  vein graft to the posterior descending coronary artery and the intermediate  coronary artery.  Noted endoscopic __________ from the right side was done.  The patient tolerated the procedure well, and was transferred up to the  intensive care unit in stable condition.  The patient was extubated later  the afternoon of surgery.  He continued to do well.  On  postoperative day  #1, the patient was seen to have a low hemoglobin of 7.3, and received 1  unit of packed red blood cells.  He was seen to be in normal sinus rhythm,  neurologic intact, and lungs clear bilaterally.  Creatinine was seen to be  stable at 1.3.  On postoperative day #2, the patient was progressing well.  He was seen to be hemodynamically stable.  Hemoglobin and hematocrit  elevated to 8.6 and 25.  He remained in normal sinus rhythm.  Creatinine  remained stable.  The patient's chest tubes and Foley were discontinued, as  well as __________.  He was transferred out to 2000 on postoperative day #2.  The patient was started on diuretics due to volume overload.  The patient  was out of bed and ambulating well.  On postoperative day #3, the patient  was progressing well.  He was afebrile.  He was satting 92% to 93% on room  air.  He remained in normal sinus rhythm.  The patient was ambulating well.  Incisions are dry and intact and healing well.  Hemoglobin and hematocrit  remained stable at 8.7 and 25.5, and creatinine remained stable at 1.3.  On  postoperative day #4, the patient was discharged to home in stable  condition.  He remained in sinus rhythm, incisions were dry and intact and  healing well.   FOLLOW UP:  1.  A follow up appointment was scheduled for Dr. Laneta Simmers for May 20, 2005      at 11:15 a.m.  2.  The patient is to contact Dr. Lindell Spar office and schedule a follow up      appointment with her in 2 weeks.  He will obtain a PA and lateral chest      x-ray at this point, and bring with him to Dr. Sharee Pimple appointment.      The patient acknowledges understanding.   DISCHARGE INSTRUCTIONS:  [name] received instructions on diet,  activity level, and incisional care.  He was told no driving until released  to do so.  No heavy lifting over 10 pounds.  He was told he was allowed to shower, washing his incisions using soap and water.  He is to contact the   office if he develops any drainage or opening from his incision sites.  The  patient acknowledges understanding.  He was educated on diet to be low fat,  low salt.  He was told to ambulate 3-4 times per  day, progress as tolerated,  and continue his breathing exercises.  The patient again acknowledges  understanding.   DISCHARGE MEDICATIONS:  1.  Aspirin 325 mg p.o. daily.  2.  Toprol-XL 25 mg p.o. daily.  3.  Zocor 40 mg p.o. q.h.s.  4.  Isosorbide mononitrate 30 mg p.o. daily.  5.  Lasix 40 mg p.o. daily x3 days.  6.  Potassium chloride 20 mEq p.o. daily x3 days.  7.  Tylox 1-2 tablets p.o. q.4h. p.r.n. pain.       KMD/MEDQ  D:  04/28/2005  T:  04/28/2005  Job:  [id number]

## 2012-02-10 ENCOUNTER — Encounter (HOSPITAL_BASED_OUTPATIENT_CLINIC_OR_DEPARTMENT_OTHER)
Admission: RE | Disposition: A | Discharge status: Home / Self Care | Payer: Self-pay | Source: Ambulatory Visit | Attending: Orthopedic Surgery

## 2012-02-10 ENCOUNTER — Ambulatory Visit (HOSPITAL_BASED_OUTPATIENT_CLINIC_OR_DEPARTMENT_OTHER)
Admission: RE | Admit: 2012-02-10 | Discharge: 2012-02-10 | Disposition: A | Discharge status: Home / Self Care | Payer: BC Managed Care – PPO | Source: Ambulatory Visit | Attending: Orthopedic Surgery | Admitting: Orthopedic Surgery

## 2012-02-10 ENCOUNTER — Other Ambulatory Visit: Payer: Self-pay | Admitting: Orthopedic Surgery

## 2012-02-10 DIAGNOSIS — M129 Arthropathy, unspecified: Secondary | ICD-10-CM | POA: Insufficient documentation

## 2012-02-10 DIAGNOSIS — M65839 Other synovitis and tenosynovitis, unspecified forearm: Secondary | ICD-10-CM | POA: Insufficient documentation

## 2012-02-10 DIAGNOSIS — M6530 Trigger finger, unspecified finger: Secondary | ICD-10-CM | POA: Insufficient documentation

## 2012-02-10 DIAGNOSIS — I10 Essential (primary) hypertension: Secondary | ICD-10-CM | POA: Insufficient documentation

## 2012-02-10 DIAGNOSIS — E119 Type 2 diabetes mellitus without complications: Secondary | ICD-10-CM | POA: Insufficient documentation

## 2012-02-10 DIAGNOSIS — M65849 Other synovitis and tenosynovitis, unspecified hand: Secondary | ICD-10-CM | POA: Insufficient documentation

## 2012-02-10 HISTORY — PX: TRIGGER FINGER RELEASE: SHX641

## 2012-02-10 SURGERY — MINOR RELEASE TRIGGER FINGER/A-1 PULLEY
Anesthesia: LOCAL | Site: Thumb | Laterality: Right | Wound class: Clean

## 2012-02-10 MED ORDER — TRAMADOL HCL 50 MG PO TABS: ORAL_TABLET | ORAL | Status: AC

## 2012-02-10 MED ORDER — LIDOCAINE HCL 2 % IJ SOLN
INTRAMUSCULAR | Status: DC | PRN
  Administered 2012-02-10: 1 mL

## 2012-02-10 SURGICAL SUPPLY — 37 items
BANDAGE ADHESIVE 1X3 (GAUZE/BANDAGES/DRESSINGS) IMPLANT
BLADE SURG 15 STRL LF DISP TIS (BLADE) ×1 IMPLANT
BLADE SURG 15 STRL SS (BLADE) ×1
BNDG ELASTIC 2 VLCR STRL LF (GAUZE/BANDAGES/DRESSINGS) ×2 IMPLANT
BNDG ESMARK 4X9 LF (GAUZE/BANDAGES/DRESSINGS) IMPLANT
BRUSH SCRUB EZ PLAIN DRY (MISCELLANEOUS) ×2 IMPLANT
CLOTH BEACON ORANGE TIMEOUT ST (SAFETY) ×2 IMPLANT
CORDS BIPOLAR (ELECTRODE) IMPLANT
COVER MAYO STAND STRL (DRAPES) ×2 IMPLANT
COVER TABLE BACK 60X90 (DRAPES) ×2 IMPLANT
CUFF TOURNIQUET SINGLE 18IN (TOURNIQUET CUFF) ×2 IMPLANT
DECANTER SPIKE VIAL GLASS SM (MISCELLANEOUS) IMPLANT
DRAPE EXTREMITY T 121X128X90 (DRAPE) IMPLANT
DRAPE SURG 17X23 STRL (DRAPES) ×2 IMPLANT
GAUZE SPONGE 4X4 12PLY STRL LF (GAUZE/BANDAGES/DRESSINGS) ×2 IMPLANT
GAUZE XEROFORM 1X8 LF (GAUZE/BANDAGES/DRESSINGS) ×2 IMPLANT
GLOVE BIO SURGEON STRL SZ 6.5 (GLOVE) ×2 IMPLANT
GLOVE BIOGEL M STRL SZ7.5 (GLOVE) ×2 IMPLANT
GLOVE BIOGEL PI IND STRL 8 (GLOVE) ×1 IMPLANT
GLOVE BIOGEL PI INDICATOR 8 (GLOVE) ×1
GLOVE ORTHO TXT STRL SZ7.5 (GLOVE) ×2 IMPLANT
GLOVE SURG SS PI 8.0 STRL IVOR (GLOVE) ×2 IMPLANT
GOWN PREVENTION PLUS XLARGE (GOWN DISPOSABLE) ×2 IMPLANT
NEEDLE 27GAX1X1/2 (NEEDLE) ×2 IMPLANT
PACK BASIN DAY SURGERY FS (CUSTOM PROCEDURE TRAY) ×2 IMPLANT
PADDING CAST ABS 4INX4YD NS (CAST SUPPLIES) ×1
PADDING CAST ABS COTTON 4X4 ST (CAST SUPPLIES) ×1 IMPLANT
SPONGE GAUZE 4X4 12PLY (GAUZE/BANDAGES/DRESSINGS) ×2 IMPLANT
STOCKINETTE 4X48 STRL (DRAPES) ×2 IMPLANT
SUT ETHILON 5 0 P 3 18 (SUTURE) ×1
SUT NYLON ETHILON 5-0 P-3 1X18 (SUTURE) ×1 IMPLANT
SYR 3ML 23GX1 SAFETY (SYRINGE) IMPLANT
SYR CONTROL 10ML LL (SYRINGE) ×2 IMPLANT
TOWEL OR 17X24 6PK STRL BLUE (TOWEL DISPOSABLE) ×4 IMPLANT
TRAY DSU PREP LF (CUSTOM PROCEDURE TRAY) ×2 IMPLANT
UNDERPAD 30X30 INCONTINENT (UNDERPADS AND DIAPERS) ×2 IMPLANT
WATER STERILE IRR 1000ML POUR (IV SOLUTION) IMPLANT

## 2012-02-10 NOTE — Op Note (Signed)
Op note dictated 02/10/12 [id number]

## 2012-02-10 NOTE — Discharge Instructions (Signed)

## 2012-02-10 NOTE — H&P (Signed)
  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 Admitted for minor release of right thumb a-1 pulley under local anesthesia. Procedure, after care risks and benefits explained in detail.  Questions invited and answered.

## 2012-02-10 NOTE — H&P (Signed)
  Patient is a 56 year old right-hand-dominant male, who presented complaining of stenosing tenosynovitis, symptoms of the right thumb and mild symptoms in the right long finger. He had both of these fingers injected x2 by a physician locally. He states that the symptoms came back approximately 2-3 months ago. He now is requesting further treatment for this predicament.   Review of patient's past medical history reveals that he is 5 foot 10 weighs approximately 190 pounds he has no known drug allergies review of systems reveals that he wears glasses.   Current medications Niaspan. 2 g per day, lovaza 2 /day, 81 mg aspirin lisinopril 5 mg, Nasonex when necessary Allegra one half tab by mouth daily, multivitamin and Crestor 10 mg 1 by mouth q. week. Past surgical history: 1975 appendectomy 2006 five-vessel coronary artery bypass surgery, 2008. Right shoulder arthroscopy 2000 and 9 repeat right shoulder arthroscopy and 2009 release of the A1 pulley of right thumb  Family history reveals a positive history of diabetes, hypertension, and arthritis. Social history reveals that the patient does not smoke. He consumes approximately 3 alcohol-based beverages per week. He is married and works at [employer] level for [employer] of his right hand reveals active triggering of the right thumb at the A1 pulley level. He also has mild triggering of the right long finger. Neurovascularly, the hand is intact. He has normal sweat pattern and dermatoglyphics. There is no active triggering of any other digits.  Impression: Stenosing tenosynovitis of the right thumb.  Plan: Patient to undergo release of the A1 pulley of the right thumb. The procedure risks, benefits, and postoperative course were discussed with the patient at length and he was in agreement with this plan.  H&P documentation: 02/10/2012  -History and Physical Reviewed  -Patient has been re-examined  -No change in the plan of  care  Wyn Forster, MD

## 2012-02-10 NOTE — Brief Op Note (Signed)
02/10/2012  3:10 PM  PATIENT:  [name]  56 y.o. male  PRE-OPERATIVE DIAGNOSIS:  sts right thumb  POST-OPERATIVE DIAGNOSIS:  locked right thumb stenosing tenosynovitis  PROCEDURE:  RELEASE TRIGGER THUMB A-1 PULLEY (RIGHT)  SURGEON:Lala Been V Naaman Plummer., MD   PHYSICIAN ASSISTANT:   ASSISTANTS: Mallory Shirk.A-C   ANESTHESIA:   local  EBL:     BLOOD ADMINISTERED:none  DRAINS: none   LOCAL MEDICATIONS USED:  XYLOCAINE 1CC 2%  SPECIMEN:  No Specimen  DISPOSITION OF SPECIMEN:  N/A  COUNTS:  YES  TOURNIQUET:   Total Tourniquet Time Documented: Forearm (Right) - 4 minutes  DICTATION: .Other Dictation: Dictation Number [id number]  PLAN OF CARE: Discharge to home after PACU  PATIENT DISPOSITION:  PACU - hemodynamically stable.

## 2012-02-11 NOTE — Op Note (Signed)
NAME:  [name], [name]                 ACCOUNT NO.:  MEDICAL RECORD NO.:  [id number]  LOCATION:                                 FACILITY:  PHYSICIAN:  Katy Fitch. Pearson Picou, M.D.      DATE OF BIRTH:  DATE OF PROCEDURE:  02/10/2012 DATE OF DISCHARGE:                              OPERATIVE REPORT   PREOPERATIVE DIAGNOSIS:  Locked right trigger thumb.  POSTOPERATIVE DIAGNOSIS:  Locked right trigger thumb.  OPERATION:  Release of right thumb A1 pulley.  OPERATING SURGEON:  Katy Fitch. Etha Stambaugh, MD  ASSISTANT:  Marveen Reeks Dasnoit, PA-C  ANESTHESIA:  2% lidocaine field block and flexor sheath block, right thumb.  This is a minor operating room procedure.  ANESTHETIST:  Dr. Teressa Senter.  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  He has a complex past medical history with early onset coronary artery disease requiring 5 coronary artery bypasses performed by Dr. Laneta Simmers in 2006.  Clinical examination at this time revealed tenderness over the right thumb A1 pulley and a locked IP joint.  We advised, [name], to undergo release of the A1 pulley under simple local anesthesia conditions.  He is scheduled for colonoscopy and is in the middle of his colonoscopy prep.  He is n.p.o. except for clear fluids at this time.  Preoperatively, he was reminded of potential risks and benefits surgery. Question invited and answered in detail.  PROCEDURE IN DETAIL:  The patient was brought to room 2 of the [facility] and placed in supine position on the operating table.  Following Betadine prep of his thumb, 1 mL of 2% lidocaine was infiltrated in the path of the intended incision and into the flexor sheath.  Within a few minutes, excellent anesthesia of the thumb was achieved. The right hand and arm were prepped with Betadine soap solution, sterilely draped.  A pneumatic tourniquet was applied  to the proximal right forearm. Following routine Betadine scrub and paint, sterile towels were applied. After routine surgical time-out, the hand and arm were exsanguinated by direct compression and the arterial tourniquet on the proximal forearm inflated to 220 mmHg.  Procedure commenced with a 1 cm transverse incision directly over the palpably thickened A1 pulley.  Subcutaneous tissues were carefully divided, taking care to identify the radial proper digital nerve.  It was gently retracted with Ragnell retractor followed by isolation of the A1 pulley.  The pulley was split with scalpel scissors.  The flexor pollicis longus was delivered and [name] then demonstrated full active range of motion of his thumb IP joint.  The wound was repaired with 2 mattress sutures of 5-0 nylon.  There were no apparent complications.  For aftercare, he was provided prescription for tramadol 50 mg 1 p.o. q.4-6 hours p.r.n. pain, 20 tablets refill.  We will see [name] back for followup in 1 week for suture removal and advancement to a rehabilitation program.     Katy Fitch. Rickell Wiehe, M.D.     RVS/MEDQ  D:  02/10/2012  T:  02/11/2012  Job:  [id number]  cc:   Donnie Coffin  Irish Lack, MD

## 2012-02-12 ENCOUNTER — Encounter (HOSPITAL_BASED_OUTPATIENT_CLINIC_OR_DEPARTMENT_OTHER): Payer: Self-pay | Admitting: Orthopedic Surgery

## 2012-05-20 ENCOUNTER — Other Ambulatory Visit: Payer: Self-pay | Admitting: Orthopedic Surgery

## 2012-05-21 ENCOUNTER — Encounter (HOSPITAL_BASED_OUTPATIENT_CLINIC_OR_DEPARTMENT_OTHER): Payer: Self-pay | Admitting: *Deleted

## 2012-05-21 NOTE — Progress Notes (Signed)
Pt had cabg 2006-sees dr Ruffin Pyo get notes To come in for bmet Scar on rt shoulder

## 2012-05-24 NOTE — H&P (Signed)
  [name] is an 56 y.o. male.   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 He has had 2 injections by Dr. Einar Grad, a rheumatologist in [facility]. He has noted swelling and discomfort in all of his MCP joints and a sense of hand aching bilaterally as well as some shoulder aching. He also has a deltoid muscle hernia from a prior rotator cuff repair performed by Dr. Priscille Kluver.  Past Medical History  Diagnosis Date  . Coronary artery disease   . Hypertension   . Hyperlipemia   . Arthritis   . GERD (gastroesophageal reflux disease)   . Sleep apnea     snores-on apnea  . Snores   . Heart murmur     Past Surgical History  Procedure Date  . Trigger finger release 02/10/2012    Procedure: MINOR RELEASE TRIGGER FINGER/A-1 PULLEY;  Surgeon: [name]., MD;  Location: [facility];  Service: Orthopedics;  Laterality: Right;  right thumb  . Coronary artery bypass graft 2006    x5  . Shoulder arthroscopy     rtx3  . I&d extremity     rt shoulder  . Trigger finger release     lt thumb  . Appendectomy     No family history on file. Social History:  reports that he has never smoked. He does not have any smokeless tobacco history on file. He reports that he drinks alcohol. He reports that he does not use illicit drugs.  Allergies: No Known Allergies  No prescriptions prior to admission    No results found for this or any previous visit (from the past 48 hour(s)).  No results found.   Pertinent items are noted in HPI.  Height 5\' 10"  (1.778 m), weight 86.183 kg (190 lb).  General appearance: alert Head: Normocephalic, without obvious abnormality Neck: supple, symmetrical, trachea midline Resp: clear to auscultation bilaterally Cardio: regular rate and  rhythm GI: normal findings: bowel sounds normal Extremities:Exam of the right long finger reveals STS. FROM of the remaining digits. N/V intact. Right shoulder reveals a  significant dimple and a diastasis between the anterior and lateral head of the deltoid on the right. Pulses: 2+ and symmetric Skin: normal Neurologic: Grossly normal    Assessment/Plan Impression: STS of the right long finger and muscle diastasis right deltoid.  Plan: To the OR for release A-1 pulley right long finger and repair muscle diastasis right deltoid. The procedure, risks and post-op course were discussed with the patient at length and he was in agreement with the plan.  [name] 05/24/2012, 8:41 PM    H&P documentation: 05/25/2012  -History and Physical Reviewed  -Patient has been re-examined  -No change in the plan of care  Tyler Forster, MD

## 2012-05-25 ENCOUNTER — Ambulatory Visit (HOSPITAL_BASED_OUTPATIENT_CLINIC_OR_DEPARTMENT_OTHER)
Admission: RE | Admit: 2012-05-25 | Discharge: 2012-05-25 | Disposition: A | Discharge status: Home / Self Care | Payer: BC Managed Care – PPO | Source: Ambulatory Visit | Attending: Orthopedic Surgery | Admitting: Orthopedic Surgery

## 2012-05-25 ENCOUNTER — Ambulatory Visit (HOSPITAL_BASED_OUTPATIENT_CLINIC_OR_DEPARTMENT_OTHER): Payer: BC Managed Care – PPO | Admitting: *Deleted

## 2012-05-25 ENCOUNTER — Encounter (HOSPITAL_BASED_OUTPATIENT_CLINIC_OR_DEPARTMENT_OTHER): Payer: Self-pay | Admitting: *Deleted

## 2012-05-25 ENCOUNTER — Encounter (HOSPITAL_BASED_OUTPATIENT_CLINIC_OR_DEPARTMENT_OTHER)
Admission: RE | Disposition: A | Discharge status: Home / Self Care | Payer: Self-pay | Source: Ambulatory Visit | Attending: Orthopedic Surgery

## 2012-05-25 DIAGNOSIS — Y838 Other surgical procedures as the cause of abnormal reaction of the patient, or of later complication, without mention of misadventure at the time of the procedure: Secondary | ICD-10-CM | POA: Insufficient documentation

## 2012-05-25 DIAGNOSIS — M6200 Separation of muscle (nontraumatic), unspecified site: Secondary | ICD-10-CM | POA: Insufficient documentation

## 2012-05-25 DIAGNOSIS — I10 Essential (primary) hypertension: Secondary | ICD-10-CM | POA: Insufficient documentation

## 2012-05-25 DIAGNOSIS — I2510 Atherosclerotic heart disease of native coronary artery without angina pectoris: Secondary | ICD-10-CM | POA: Insufficient documentation

## 2012-05-25 DIAGNOSIS — K219 Gastro-esophageal reflux disease without esophagitis: Secondary | ICD-10-CM | POA: Insufficient documentation

## 2012-05-25 DIAGNOSIS — G4730 Sleep apnea, unspecified: Secondary | ICD-10-CM | POA: Insufficient documentation

## 2012-05-25 DIAGNOSIS — IMO0002 Reserved for concepts with insufficient information to code with codable children: Secondary | ICD-10-CM | POA: Insufficient documentation

## 2012-05-25 DIAGNOSIS — M65839 Other synovitis and tenosynovitis, unspecified forearm: Secondary | ICD-10-CM | POA: Insufficient documentation

## 2012-05-25 DIAGNOSIS — E785 Hyperlipidemia, unspecified: Secondary | ICD-10-CM | POA: Insufficient documentation

## 2012-05-25 HISTORY — PX: TRIGGER FINGER RELEASE: SHX641

## 2012-05-25 HISTORY — DX: Gastro-esophageal reflux disease without esophagitis: K21.9

## 2012-05-25 HISTORY — DX: Atherosclerotic heart disease of native coronary artery without angina pectoris: I25.10

## 2012-05-25 HISTORY — DX: Snoring: R06.83

## 2012-05-25 HISTORY — PX: SCAR REVISION: SHX5285

## 2012-05-25 HISTORY — DX: Cardiac murmur, unspecified: R01.1

## 2012-05-25 HISTORY — DX: Essential (primary) hypertension: I10

## 2012-05-25 HISTORY — DX: Hyperlipidemia, unspecified: E78.5

## 2012-05-25 HISTORY — DX: Sleep apnea, unspecified: G47.30

## 2012-05-25 HISTORY — DX: Unspecified osteoarthritis, unspecified site: M19.90

## 2012-05-25 LAB — POCT I-STAT, CHEM 8
Calcium, Ion: 1.26 mmol/L (ref 1.12–1.32)
Chloride: 103 mEq/L (ref 96–112)
Glucose, Bld: 102 mg/dL — ABNORMAL HIGH (ref 70–99)
HCT: 49 % (ref 39.0–52.0)
Hemoglobin: 16.7 g/dL (ref 13.0–17.0)
Potassium: 3.7 mEq/L (ref 3.5–5.1)

## 2012-05-25 SURGERY — RELEASE, A1 PULLEY, FOR TRIGGER FINGER
Anesthesia: General | Site: Shoulder | Laterality: Right | Wound class: Clean

## 2012-05-25 MED ORDER — CEPHALEXIN 500 MG PO CAPS: 500.0000 mg | ORAL_CAPSULE | Freq: Three times a day (TID) | ORAL | Status: AC

## 2012-05-25 MED ORDER — DEXAMETHASONE SODIUM PHOSPHATE 4 MG/ML IJ SOLN
INTRAMUSCULAR | Status: DC | PRN
  Administered 2012-05-25: 10 mg via INTRAVENOUS

## 2012-05-25 MED ORDER — ONDANSETRON HCL 4 MG/2ML IJ SOLN: 4.0000 mg | Freq: Once | INTRAMUSCULAR | Status: DC | PRN

## 2012-05-25 MED ORDER — OXYCODONE-ACETAMINOPHEN 5-325 MG PO TABS
1.0000 | ORAL_TABLET | Freq: Once | ORAL | Status: AC
  Administered 2012-05-25: 1 via ORAL

## 2012-05-25 MED ORDER — OXYCODONE-ACETAMINOPHEN 5-325 MG PO TABS: ORAL_TABLET | ORAL | Status: DC

## 2012-05-25 MED ORDER — FENTANYL CITRATE 0.05 MG/ML IJ SOLN
INTRAMUSCULAR | Status: DC | PRN
  Administered 2012-05-25: 100 ug via INTRAVENOUS

## 2012-05-25 MED ORDER — ONDANSETRON HCL 4 MG/2ML IJ SOLN
INTRAMUSCULAR | Status: DC | PRN
  Administered 2012-05-25: 4 mg via INTRAVENOUS

## 2012-05-25 MED ORDER — MIDAZOLAM HCL 5 MG/5ML IJ SOLN
INTRAMUSCULAR | Status: DC | PRN
  Administered 2012-05-25: 2 mg via INTRAVENOUS

## 2012-05-25 MED ORDER — LACTATED RINGERS IV SOLN
INTRAVENOUS | Status: DC
  Administered 2012-05-25 (×2): via INTRAVENOUS

## 2012-05-25 MED ORDER — CHLORHEXIDINE GLUCONATE 4 % EX LIQD: 60.0000 mL | Freq: Once | CUTANEOUS | Status: DC

## 2012-05-25 MED ORDER — HYDROMORPHONE HCL PF 1 MG/ML IJ SOLN: 0.2500 mg | INTRAMUSCULAR | Status: DC | PRN

## 2012-05-25 MED ORDER — PROPOFOL 10 MG/ML IV EMUL
INTRAVENOUS | Status: DC | PRN
  Administered 2012-05-25: 200 mg via INTRAVENOUS

## 2012-05-25 MED ORDER — CEFAZOLIN SODIUM 1-5 GM-% IV SOLN
INTRAVENOUS | Status: DC | PRN
  Administered 2012-05-25: 2 g via INTRAVENOUS

## 2012-05-25 MED ORDER — CEPHALEXIN 500 MG PO CAPS: 500.0000 mg | ORAL_CAPSULE | Freq: Three times a day (TID) | ORAL | Status: DC

## 2012-05-25 MED ORDER — EPHEDRINE SULFATE 50 MG/ML IJ SOLN
INTRAMUSCULAR | Status: DC | PRN
  Administered 2012-05-25: 10 mg via INTRAVENOUS
  Administered 2012-05-25: 5 mg via INTRAVENOUS

## 2012-05-25 MED ORDER — LIDOCAINE HCL 2 % IJ SOLN
INTRAMUSCULAR | Status: DC | PRN
  Administered 2012-05-25: 2 mL
  Administered 2012-05-25: 5 mL

## 2012-05-25 MED ORDER — CEFAZOLIN SODIUM-DEXTROSE 2-3 GM-% IV SOLR: 2.0000 g | Freq: Once | INTRAVENOUS | Status: DC

## 2012-05-25 MED ORDER — LIDOCAINE HCL (CARDIAC) 20 MG/ML IV SOLN
INTRAVENOUS | Status: DC | PRN
  Administered 2012-05-25: 60 mg via INTRAVENOUS

## 2012-05-25 SURGICAL SUPPLY — 35 items
BLADE SURG 15 STRL LF DISP TIS (BLADE) ×2 IMPLANT
BLADE SURG 15 STRL SS (BLADE) ×1
BNDG ELASTIC 2 VLCR STRL LF (GAUZE/BANDAGES/DRESSINGS) ×3 IMPLANT
BNDG ESMARK 4X9 LF (GAUZE/BANDAGES/DRESSINGS) ×3 IMPLANT
BRUSH SCRUB EZ PLAIN DRY (MISCELLANEOUS) ×3 IMPLANT
CLOTH BEACON ORANGE TIMEOUT ST (SAFETY) ×3 IMPLANT
CORDS BIPOLAR (ELECTRODE) ×3 IMPLANT
COVER MAYO STAND STRL (DRAPES) ×3 IMPLANT
COVER TABLE BACK 60X90 (DRAPES) ×3 IMPLANT
CUFF TOURNIQUET SINGLE 18IN (TOURNIQUET CUFF) IMPLANT
DECANTER SPIKE VIAL GLASS SM (MISCELLANEOUS) IMPLANT
DRAPE EXTREMITY T 121X128X90 (DRAPE) IMPLANT
DRAPE SURG 17X23 STRL (DRAPES) ×3 IMPLANT
DRAPE U-SHAPE 76X120 STRL (DRAPES) ×6 IMPLANT
GAUZE SPONGE 4X4 12PLY STRL LF (GAUZE/BANDAGES/DRESSINGS) ×6 IMPLANT
GAUZE XEROFORM 1X8 LF (GAUZE/BANDAGES/DRESSINGS) IMPLANT
GLOVE BIO SURGEON STRL SZ7 (GLOVE) ×3 IMPLANT
GLOVE BIOGEL M STRL SZ7.5 (GLOVE) ×3 IMPLANT
GLOVE ORTHO TXT STRL SZ7.5 (GLOVE) ×6 IMPLANT
GOWN PREVENTION PLUS XLARGE (GOWN DISPOSABLE) ×3 IMPLANT
GOWN STRL REIN XL XLG (GOWN DISPOSABLE) ×6 IMPLANT
NEEDLE 27GAX1X1/2 (NEEDLE) ×3 IMPLANT
PACK BASIN DAY SURGERY FS (CUSTOM PROCEDURE TRAY) ×3 IMPLANT
PAD CAST 4YDX4 CTTN HI CHSV (CAST SUPPLIES) ×2 IMPLANT
PADDING CAST COTTON 4X4 STRL (CAST SUPPLIES) ×1
SPONGE GAUZE 4X4 12PLY (GAUZE/BANDAGES/DRESSINGS) ×3 IMPLANT
STOCKINETTE 4X48 STRL (DRAPES) ×3 IMPLANT
SUT VIC AB 0 CT1 27 (SUTURE) ×1
SUT VIC AB 0 CT1 27XBRD ANBCTR (SUTURE) ×2 IMPLANT
SUT VIC AB 2-0 SH 27 (SUTURE) ×1
SUT VIC AB 2-0 SH 27XBRD (SUTURE) ×2 IMPLANT
SYR CONTROL 10ML LL (SYRINGE) ×3 IMPLANT
TOWEL OR 17X24 6PK STRL BLUE (TOWEL DISPOSABLE) ×6 IMPLANT
UNDERPAD 30X30 INCONTINENT (UNDERPADS AND DIAPERS) ×3 IMPLANT
WATER STERILE IRR 1000ML POUR (IV SOLUTION) IMPLANT

## 2012-05-25 NOTE — Op Note (Signed)
[id number] 

## 2012-05-25 NOTE — Anesthesia Procedure Notes (Signed)
Procedure Name: LMA Insertion Date/Time: 05/25/2012 9:50 AM Performed by: Meyer Russel Pre-anesthesia Checklist: Patient identified, Emergency Drugs available, Suction available and Patient being monitored Patient Re-evaluated:Patient Re-evaluated prior to inductionOxygen Delivery Method: Circle System Utilized Preoxygenation: Pre-oxygenation with 100% oxygen Intubation Type: IV induction Ventilation: Mask ventilation without difficulty LMA: LMA inserted LMA Size: 5.0 Number of attempts: 1 Airway Equipment and Method: bite block Placement Confirmation: positive ETCO2 and breath sounds checked- equal and bilateral Tube secured with: Tape Dental Injury: Teeth and Oropharynx as per pre-operative assessment

## 2012-05-25 NOTE — Anesthesia Preprocedure Evaluation (Addendum)
Anesthesia Evaluation  Patient identified by MRN, date of birth, ID band Patient awake    Reviewed: Allergy & Precautions, H&P , NPO status , Patient's Chart, lab work & pertinent test results  Airway Mallampati: I TM Distance: >3 FB Neck ROM: Full    Dental  (+) Teeth Intact and Dental Advisory Given   Pulmonary  breath sounds clear to auscultation        Cardiovascular hypertension, Pt. on medications + CAD + Valvular Problems/Murmurs (mild AI) AI Rhythm:Regular Rate:Normal     Neuro/Psych    GI/Hepatic GERD-  Medicated and Controlled,  Endo/Other    Renal/GU      Musculoskeletal   Abdominal   Peds  Hematology   Anesthesia Other Findings   Reproductive/Obstetrics                           Anesthesia Physical Anesthesia Plan  ASA: III  Anesthesia Plan: General   Post-op Pain Management:    Induction: Intravenous  Airway Management Planned: LMA  Additional Equipment:   Intra-op Plan:   Post-operative Plan: Extubation in OR  Informed Consent: I have reviewed the patients History and Physical, chart, labs and discussed the procedure including the risks, benefits and alternatives for the proposed anesthesia with the patient or authorized representative who has indicated his/her understanding and acceptance.   Dental advisory given  Plan Discussed with: CRNA, Anesthesiologist and Surgeon  Anesthesia Plan Comments:         Anesthesia Quick Evaluation

## 2012-05-25 NOTE — Transfer of Care (Signed)
Immediate Anesthesia Transfer of Care Note  Patient: [name]  Procedure(s) Performed: Procedure(s) (LRB): RELEASE TRIGGER FINGER/A-1 PULLEY (Right) SCAR REVISION (Right)  Patient Location: PACU  Anesthesia Type: General  Level of Consciousness: sedated  Airway & Oxygen Therapy: Patient Spontanous Breathing and Patient connected to face mask oxygen  Post-op Assessment: Report given to PACU RN and Post -op Vital signs reviewed and stable  Post vital signs: Reviewed and stable  Complications: No apparent anesthesia complications

## 2012-05-25 NOTE — Brief Op Note (Signed)
05/25/2012  10:59 AM  PATIENT:  Marshell Levan  56 y.o. male  PRE-OPERATIVE DIAGNOSIS:  trigger right long, deltoid muscle diastasis and origin avulsion right shoulder  POST-OPERATIVE DIAGNOSIS:  same as preop  PROCEDURE:  Procedure(s) (LRB): RELEASE TRIGGER FINGER/A-1 PULLEY (Right) RECONSTRUCTION OF DELTOID ORIGIN DIASTASIS WITH BURSECTOMY AND TENOLYSIS OF ROTATOR CUFF ADHESIONS  SURGEON:  Surgeon(s) and Role:    * Wyn Forster., MD - Primary  PHYSICIAN ASSISTANT:   ASSISTANTS:Lion Fernandez Dasnoit,P.A-C   ANESTHESIA:   general  EBL:  Total I/O In: 1000 [I.V.:1000] Out: -   BLOOD ADMINISTERED:none  DRAINS: none   LOCAL MEDICATIONS USED:  LIDOCAINE   SPECIMEN:  No Specimen  DISPOSITION OF SPECIMEN:  N/A  COUNTS:  YES  TOURNIQUET:  * Missing tourniquet times found for documented tourniquets in log:  [id number] *  DICTATION: .Other Dictation: Dictation Number [id number]  PLAN OF CARE: Discharge to home after PACU  PATIENT DISPOSITION:  PACU - hemodynamically stable.

## 2012-05-25 NOTE — Anesthesia Postprocedure Evaluation (Signed)
  Anesthesia Post-op Note  Patient: [name]  Procedure(s) Performed: Procedure(s) (LRB): RELEASE TRIGGER FINGER/A-1 PULLEY (Right) SCAR REVISION (Right)  Patient Location: PACU  Anesthesia Type: General  Level of Consciousness: awake, alert  and oriented  Airway and Oxygen Therapy: Patient Spontanous Breathing  Post-op Pain: mild  Post-op Assessment: Post-op Vital signs reviewed  Post-op Vital Signs: Reviewed  Complications: No apparent anesthesia complications

## 2012-05-25 NOTE — Discharge Instructions (Signed)

## 2012-05-26 NOTE — Op Note (Signed)
[name], [name]            ACCOUNT NO.:  [id number]  MEDICAL RECORD NO.:  [id number]  LOCATION:                                 FACILITY:  PHYSICIAN:  Katy Fitch. Janssen Zee, M.D. DATE OF BIRTH:  Nov 30, 1955  DATE OF PROCEDURE:  05/25/2012 DATE OF DISCHARGE:                              OPERATIVE REPORT   PREOPERATIVE DIAGNOSES: 1. Chronic anterior middle-third deltoid muscle diastasis following a     complicated wound following prior rotator cuff repair. 2. Chronic stenosing tenosynovitis of right long finger with a 15-     degree flexion contracture of PIP joint.  POSTOPERATIVE DIAGNOSES: 1. Chronic anterior middle-third deltoid muscle diastasis following a     complicated wound following prior rotator cuff repair. 2. Chronic stenosing tenosynovitis of right long finger with a 15-     degree flexion contracture of PIP joint.  OPERATION: 1. Reconstruction of deltoid muscle diastasis with scar resection and     revision, pants-over-vest reconstruction of deltoid origin to     acromion and extensive tenolysis of rotator cuff with resection of     scarred bursa and bursal adhesions to rotator cuff. 2. Release of right long finger A1 pulley through separate incision.  OPERATING SURGEON:  Katy Fitch. Analiyah Lechuga, MD.  ASSISTANT:  Marveen Reeks. Dasnoit, PA-C.  ANESTHESIA:  General by LMA.  SUPERVISING ANESTHESIOLOGIST:  Sheldon Silvan, MD.  INDICATIONS:  [name] is a 56 year old gentleman, who is self referred for evaluation and management of a chronic right long locking trigger finger.  He was noted to have a 15-degree flexion contracture of his right long finger due to chronic stenosing tenosynovitis and marked thickening in the palm at the side of the A1 pulley.  He requested that we proceed with appropriate surgical intervention to correct this trigger finger and relieve his flexion contractures.  We pointed out to him that with a chronic trigger finger, it may  be challenging to completely relieve the flexion contracture of the PIP joint.  We recommended release of A1 pulley and therapy.  He also asked me to evaluate his shoulder.  He had prior rotator cuff repair by one of our retired Acupuncturist.  In the postoperative period, he developed what sounds like a suture abscess.  This was "lanced" in the office leading to a wide deltoid diastasis with a full-thickness scar that communicated with the subacromial bursa.  He appeared to have significant adhesions with dimpling at the scar.  We advised him that we could proceed with a deltoid diastasis revision with resection of the scar, bursectomy, and reconstruction of the deltoid origin.  After informed consent, he was brought to the operating room at this time.  Preoperatively, he was interviewed by Dr. Ivin Booty of Anesthesia who recommended general anesthesia by LMA technique.  Questions were invited and answered in detail.  PROCEDURE:  [name] was brought to room 1 of the [facility] and placed supine position on the operating table.  Following the induction of general anesthesia by LMA technique under Dr. Ivin Booty' direct supervision, the right arm was prepped from fingertip to the base of the neck with Betadine soap and solution.  Sterile stockinette was applied to the  arm and shoulder.  Split drapes were applied to expose the entire forequarter.  Following routine surgical time-out, we exsanguinated the right hand and arm with an Esmarch bandage that was left on the proximal forearm as a tourniquet.  Procedure commenced with a routine surgical time-out.  An oblique incision was fashioned in the distal palmar crease.  The palmar fascia identified and released.  A markedly fibrotic and thickened A1 pulley was identified.  The pulley was split with scalpel and scissors, and a small A0 pulley was identified and released.  The flexor tendons delivered and found to have a  thick cuff of chronic synovium that had built up due to prolonged periods of stenosing tenosynovitis.  The synovium was removed with scissors and rongeur dissection. Thereafter free range of motion of the fingers recovered.  We could passively extend the PIP joint to a 3-degree flexion contracture.  The wound was repaired with intradermal 3-0 Prolene and Steri-Strips and a compressive hand dressing with sterile gauze and Ace wrap.  Tourniquet was released, immediate capillary fill of the fingers and thumb.  The table was then elevated and the back of the table raised approximately 20 degrees.  We again confirmed our plan to proceed with the deltoid diastasis reconstruction.  The prior surgical scar was excised down to the level of the subdeltoid bursa.  Digital dissection revealed that there were many very significant adhesions between the rotator cuff and the deep surface of the acromion and the deltoid muscle.  With a combination of scissor dissection, rongeur dissection, use of a Cobb elevator, key elevator, and extensive tenolysis, we freed the adhesions from the acromion to the rotator cuff as well as the deep surface of the deltoid.  Redundant bursa was removed.  Hemostasis was achieved with bipolar electrocautery.  We then removed all the scarred portion of deltoid muscle leaving a defect measuring approximately 4.5 cm in length and about 3 cm in width. We then mobilized the muscle from the deep fascia.  We mobilized the muscle from the bursa and subsequently advanced the anterior head of the deltoid posteriorly to the lateral head and performed a pants-over-vest repair with multiple mattress sutures of #1 TiCron, reinforcing the acromial origin of the deltoid.  The muscle split distally was repaired with simple suture of 0 Vicryl.  The wound was then repaired in layers with deep suture of 0 Vicryl, deep subcutaneous suture of 2-0 Vicryl, and intradermal 3-0 Prolene  with Steri-Strips.  A very cosmetic reconstruction was achieved.  For aftercare, we will allow Mr. Ritzel to begin immediate active range of motion exercises to the shoulder to prevent recurrence of his subacromial and subdeltoid adhesions.  He will begin immediate range of motion excises of the finger.  We will see him back for followup in a week for suture removal from the hand and anticipate suture removal in the shoulder in about 2 weeks postop.  Questions were invited and answered in detail.     Katy Fitch Adedamola Seto, M.D.     RVS/MEDQ  D:  05/25/2012  T:  05/25/2012  Job:  [id number]

## 2012-05-28 ENCOUNTER — Encounter (HOSPITAL_BASED_OUTPATIENT_CLINIC_OR_DEPARTMENT_OTHER): Payer: Self-pay | Admitting: Orthopedic Surgery

## 2012-06-01 ENCOUNTER — Encounter (HOSPITAL_BASED_OUTPATIENT_CLINIC_OR_DEPARTMENT_OTHER): Payer: Self-pay

## 2013-09-12 ENCOUNTER — Other Ambulatory Visit: Payer: Self-pay | Admitting: *Deleted

## 2013-09-12 DIAGNOSIS — E785 Hyperlipidemia, unspecified: Secondary | ICD-10-CM

## 2013-10-07 ENCOUNTER — Other Ambulatory Visit: Payer: BC Managed Care – PPO

## 2013-10-14 ENCOUNTER — Ambulatory Visit: Payer: BC Managed Care – PPO | Admitting: Pharmacist

## 2013-11-22 ENCOUNTER — Other Ambulatory Visit: Payer: BC Managed Care – PPO

## 2013-11-29 ENCOUNTER — Encounter: Payer: Self-pay | Admitting: Pharmacist

## 2013-12-26 ENCOUNTER — Other Ambulatory Visit: Payer: BC Managed Care – PPO

## 2013-12-30 ENCOUNTER — Telehealth: Payer: Self-pay | Admitting: Pharmacist

## 2013-12-30 DIAGNOSIS — Z79899 Other long term (current) drug therapy: Secondary | ICD-10-CM

## 2013-12-30 DIAGNOSIS — E785 Hyperlipidemia, unspecified: Secondary | ICD-10-CM

## 2013-12-30 NOTE — Telephone Encounter (Signed)
Called patient as he missed his cholesterol lab work last week and was suppose to see me tomorrow.  Patient showed up to get lab work as scheduled, but after waiting an hour he had to leave for work.  Unfortunately it was a very busy day at our office.    He tells me that he hasn't been taking his Welchol every day and would like to r/s lab work until he has been on it consistently, which I agree with.  Patient will take Welchol 3.75 g daily and recheck NMR / LFTs in 6 weeks, then see me 5 days later.  Appointment made.

## 2014-01-02 ENCOUNTER — Encounter: Payer: Self-pay | Admitting: Pharmacist

## 2014-01-05 ENCOUNTER — Other Ambulatory Visit: Payer: Self-pay | Admitting: Cardiology

## 2014-01-05 MED ORDER — LISINOPRIL 5 MG PO TABS: 5.0000 mg | ORAL_TABLET | Freq: Every day | ORAL | Status: DC

## 2014-02-03 ENCOUNTER — Telehealth: Payer: Self-pay | Admitting: Interventional Cardiology

## 2014-02-03 NOTE — Telephone Encounter (Signed)
New problem    [facility] I you have any questions give pt a call.

## 2014-02-05 NOTE — Telephone Encounter (Signed)
Patient was on Welchol in powder/liquid form.  Please send in prescription for Welchol 625 mg tablets instead per his request, with instructions to take 3 tablets twice daily (#180 for a 1 month supply) with 11 refills.  Please notify patient and update med list as well.  Thanks.

## 2014-02-06 MED ORDER — COLESEVELAM HCL 625 MG PO TABS: 1875.0000 mg | ORAL_TABLET | Freq: Two times a day (BID) | ORAL | Status: DC

## 2014-02-06 NOTE — Telephone Encounter (Signed)
Refilled welchol, lmtrc

## 2014-02-07 NOTE — Telephone Encounter (Signed)
Pt.notified

## 2014-02-10 ENCOUNTER — Other Ambulatory Visit: Payer: Self-pay

## 2014-02-15 ENCOUNTER — Ambulatory Visit: Payer: Self-pay | Admitting: Pharmacist

## 2014-02-22 ENCOUNTER — Other Ambulatory Visit: Payer: Self-pay | Admitting: Interventional Cardiology

## 2014-03-17 ENCOUNTER — Other Ambulatory Visit: Payer: Self-pay

## 2014-03-23 ENCOUNTER — Ambulatory Visit: Payer: Self-pay | Admitting: Pharmacist

## 2014-03-23 ENCOUNTER — Ambulatory Visit: Payer: Self-pay | Admitting: Interventional Cardiology

## 2014-04-13 ENCOUNTER — Other Ambulatory Visit (INDEPENDENT_AMBULATORY_CARE_PROVIDER_SITE_OTHER): Payer: BC Managed Care – PPO

## 2014-04-13 ENCOUNTER — Other Ambulatory Visit: Payer: Self-pay | Admitting: Interventional Cardiology

## 2014-04-13 DIAGNOSIS — Z79899 Other long term (current) drug therapy: Secondary | ICD-10-CM

## 2014-04-13 DIAGNOSIS — E785 Hyperlipidemia, unspecified: Secondary | ICD-10-CM

## 2014-04-13 LAB — HEPATIC FUNCTION PANEL
ALT: 21 U/L (ref 0–53)
AST: 21 U/L (ref 0–37)
Albumin: 4.2 g/dL (ref 3.5–5.2)
Alkaline Phosphatase: 48 U/L (ref 39–117)
BILIRUBIN TOTAL: 1.1 mg/dL (ref 0.2–1.2)
Bilirubin, Direct: 0 mg/dL (ref 0.0–0.3)
Total Protein: 7.3 g/dL (ref 6.0–8.3)

## 2014-04-14 LAB — NMR LIPOPROFILE WITH LIPIDS
CHOLESTEROL, TOTAL: 200 mg/dL — AB (ref <200)
HDL Particle Number: 31.2 umol/L (ref >=30.5)
HDL Size: 10.4 nm (ref >=9.2)
HDL-C: 33 mg/dL — ABNORMAL LOW (ref >=40)
LDL CALC: 119 mg/dL — AB (ref <100)
LDL PARTICLE NUMBER: 1146 nmol/L — AB (ref <1000)
LDL Size: 20 nm — ABNORMAL LOW (ref >20.5)
LP-IR SCORE: 42 (ref <=45)
Large HDL-P: 4.6 umol/L — ABNORMAL LOW (ref >=4.8)
Large VLDL-P: 2.4 nmol/L (ref <=2.7)
SMALL LDL PARTICLE NUMBER: 829 nmol/L — AB (ref <=527)
Triglycerides: 240 mg/dL — ABNORMAL HIGH (ref <150)
VLDL SIZE: 46.4 nm (ref <=46.6)

## 2014-04-20 ENCOUNTER — Ambulatory Visit (INDEPENDENT_AMBULATORY_CARE_PROVIDER_SITE_OTHER): Payer: BC Managed Care – PPO | Admitting: Pharmacist

## 2014-04-20 ENCOUNTER — Other Ambulatory Visit: Payer: Self-pay

## 2014-04-20 VITALS — Wt 199.0 lb

## 2014-04-20 DIAGNOSIS — Z79899 Other long term (current) drug therapy: Secondary | ICD-10-CM

## 2014-04-20 DIAGNOSIS — E785 Hyperlipidemia, unspecified: Secondary | ICD-10-CM

## 2014-04-20 MED ORDER — LISINOPRIL 5 MG PO TABS: 5.0000 mg | ORAL_TABLET | Freq: Every day | ORAL | Status: DC

## 2014-04-20 NOTE — Patient Instructions (Signed)
1.  If you haven't heard from me by mid-August, call me [phone].   2.  Will try to get you onto PCSK-9 inhibitor when comes to market. 3.  Lab on 09/28/14 - NMR LipoProfile and liver function. 4.  See Riki Rusk on 10/05/14 at 4:00 pm

## 2014-04-20 NOTE — Progress Notes (Signed)
Patient here for followup of h/o elevated LDL-P and early CAD. Patient previously struggled to tolerate Lipitor and Crestor due to tendon pain and weakness, especially in his hands. Patient only on Crestor 10 mg qweek since 12/2012.  Patient was being treated by rheumatology for psoriatic arthritis.  This pain and weakness seems to be present both on and off statins, however he feels daily statins have made it worse in the past. He feels the statins have caused a thickening of his tendons as many of his friends all have the same issues, in their tendons (especially in their fingers/grip) on statins. Patient stopped taking fish oil as well after finding some evidence on internet about fish oil causing tendon thickening. Zetia caused him to become extremely sedated and joints to ache, so he stopped this on his own recently. Patient okay on the weekly Crestor, and doesn't want to restart niacin or fish oil. He most recently tried [employer], but had to stop due to severe GI upset and constipation.  Tried both powder and pill form of Welchol.  He thinks he took simvastatin at low dose in the past, and would consider taking this in the future if he isn't able to get on a PCSK-9 inhibitor in the near future.  He is fearful of trying this now as his joints and fingers are doing well currently.  Cardiac risk factors CAD (CABG 2006), age.  Target LDL < 70 ; LDL-P < 1000.  Target non-HDL <100.  Medications currently taking: Crestor 10 mg qweek. Intolerance:  Crestor qd and biw (joint aches), Lipitor daily (joint aches), Zetia (myalgias and sedation), OTC fish oil (dyspepsia, and he felt it worsened his tendon thickening). Non-compliant with: Plant stanols (Benecol chews) - just forgets to take them. His TG and small LDL-P have gone up since being off niaspan, and he doesn't want to go back on this. Niacin he felt was exacerbating hand/finger pain, so stopped this.  Diet patient living on the road and his diet has been  erratic.  Exercise unable to exercise on bike or running due to tendon and hand problems (weakness and pain).  Labs LDL-P went from 1100 up to 2150 in past, and now down to ~ 1150 nmol/L again on just Crestor 10 mg once weekly. Not interested in going back on fish oil, zetia, niacin, or welchol. Patient and I have discussed PCSK-9 inhibitors in the past, and now that they will be available soon, patient would like to get on one of these if possible in the coming months.  Labs: 03/2014:  LDL-P number 1146, LDL 119, TC 200, TG 240, HDL 33, LFTs normal (Crestor 10 mg qweek).  His LDL-P number has historically been higher than this on this regimen.  Current Outpatient Prescriptions  Medication Sig Dispense Refill  . aspirin 81 MG tablet Take 81 mg by mouth daily.      . fexofenadine (ALLEGRA) 60 MG tablet Take 60 mg by mouth daily.      Marland Kitchen lisinopril (PRINIVIL,ZESTRIL) 5 MG tablet TAKE 1 TABLET (5 MG TOTAL) BY MOUTH DAILY.  30 tablet  0  . rosuvastatin (CRESTOR) 10 MG tablet Take 10 mg by mouth 7 days.       No current facility-administered medications for this visit.   Allergies  Allergen Reactions  . Crestor [Rosuvastatin]     Joint and tendon pain when taking daily or twice weekly  . Fish Oil     Dyspepsia   . Lipitor [Atorvastatin]  Severe joint and tendon pain  . Niacin And Related     Joint pain in hands  . Welchol [Colesevelam Hcl]     GI upset and constipation  . Zetia [Ezetimibe]     sedation   .famhx

## 2014-04-20 NOTE — Assessment & Plan Note (Signed)
LDL-P and LDL-C are both slightly above goal in patient with a h/o CABG 9 years ago.  Doesn't tolerate multiple lipid lowering regimens, but is eager to get on a PCSK-9 inhibitor when they become available.  We discussed changing qweek Crestor to daily low dose simvastatin, but given his joints aren't bothering him, he would rather wait until new agents are available to him.  If they don't adequately control his cholesterol, or if he can't get on one of the PCSK-9 inhibitors for some reason, he would be willing to change to simvastatin 10-20 mg daily.  Will recheck cholesterol in 6 months and see me a few days later.  I will call him in a few months when these newer medications become available.

## 2014-04-25 ENCOUNTER — Other Ambulatory Visit: Payer: Self-pay

## 2014-04-25 MED ORDER — LISINOPRIL 5 MG PO TABS
5.0000 mg | ORAL_TABLET | Freq: Every day | ORAL | Status: DC
Start: 2014-04-25 — End: 2014-12-22

## 2014-05-05 ENCOUNTER — Encounter: Payer: Self-pay | Admitting: Interventional Cardiology

## 2014-05-05 ENCOUNTER — Ambulatory Visit (INDEPENDENT_AMBULATORY_CARE_PROVIDER_SITE_OTHER): Payer: BC Managed Care – PPO | Admitting: Interventional Cardiology

## 2014-05-05 VITALS — BP 148/80 | HR 50 | Ht 70.0 in | Wt 198.0 lb

## 2014-05-05 DIAGNOSIS — I10 Essential (primary) hypertension: Secondary | ICD-10-CM

## 2014-05-05 DIAGNOSIS — E785 Hyperlipidemia, unspecified: Secondary | ICD-10-CM

## 2014-05-05 DIAGNOSIS — I2510 Atherosclerotic heart disease of native coronary artery without angina pectoris: Secondary | ICD-10-CM

## 2014-05-05 NOTE — Progress Notes (Signed)
Patient ID: [name], male   DOB: 07/04/1956, 58 y.o.   MRN: [id number]    [address] [place], [place]  [id number] Phone: [phone]) [phone] Fax:  [phone]  Date:  05/09/2014   ID:  [name], DOB 07/04/1956, MRN [id number]  PCP:  No primary provider on file.      History of Present Illness: [name] is a 58 y.o. male who has CAD. He has been diagnosed with psoriatic arthritis. His Niaspan was stopped due to exacerbating his arthritis. He has been jogging, walking regularly.  MTX and Humira did not work.  He will be stating Enbrel. He has not had any chest pain or SHOB. He feels he is well form a cardiovascular standpoint. His biggest complaint is from arthritis, fatigue. He is taking crestor once a week, but he has not tolerated more. CAD/ASCVD:  c/o Fatigue.  Denies : Chest pain.  Diaphoresis.  Dizziness.  Leg edema.  Nitroglycerin.  Orthopnea.  Palpitations.  Paroxysmal nocturnal dyspnea.   He ran a 5K recently without any cardiac sx.  He is interested in PCSK9 inhibitor.  Wt Readings from Last 3 Encounters:  05/05/14 198 lb (89.812 kg)  04/20/14 199 lb (90.266 kg)  05/21/12 190 lb (86.183 kg)     Past Medical History  Diagnosis Date  . Coronary artery disease   . Hypertension   . Hyperlipemia   . Arthritis   . GERD (gastroesophageal reflux disease)   . Sleep apnea     snores-on apnea  . Snores   . Heart murmur     Current Outpatient Prescriptions  Medication Sig Dispense Refill  . aspirin 81 MG tablet Take 81 mg by mouth daily.      . fexofenadine (ALLEGRA) 60 MG tablet Take 60 mg by mouth daily.      Marland Kitchen. lisinopril (PRINIVIL,ZESTRIL) 5 MG tablet Take 1 tablet (5 mg total) by mouth daily.  90 tablet  2  . rosuvastatin (CRESTOR) 10 MG tablet Take 10 mg by mouth 7 days.       No current facility-administered medications for this visit.    Allergies:    Allergies  Allergen Reactions  . Crestor [Rosuvastatin]     Joint  and tendon pain when taking daily or twice weekly  . Fish Oil     Dyspepsia   . Lipitor [Atorvastatin]     Severe joint and tendon pain  . Niacin And Related     Joint pain in hands  . Welchol [Colesevelam Hcl]     GI upset and constipation  . Zetia [Ezetimibe]     sedation    Social History:  The patient  reports that he has never smoked. He does not have any smokeless tobacco history on file. He reports that he drinks alcohol. He reports that he does not use illicit drugs.   Family History:  The patient's family history includes Colon cancer in his father.   ROS:  Please see the history of present illness.  No nausea, vomiting.  No fevers, chills.  No focal weakness.  No dysuria.    All other systems reviewed and negative.   PHYSICAL EXAM: VS:  BP 148/80  Pulse 50  Ht 5\' 10"  (1.778 m)  Wt 198 lb (89.812 kg)  BMI 28.41 kg/m2 Well nourished, well developed, in no acute distress HEENT: normal Neck: no JVD, no carotid bruits Cardiac:  normal S1, S2; RRR;  Lungs:  clear to auscultation  bilaterally, no wheezing, rhonchi or rales Abd: soft, nontender, no hepatomegaly Ext: no edema Skin: warm and dry Neuro:   no focal abnormalities noted  EKG:  NSR, LVH, NSST     ASSESSMENT AND PLAN:  Coronary atherosclerosis of native coronary artery  Continue Aspirin Tablet, 81mg , 1 tablet, Orally, Once a day Diagnostic Imaging:EKG Harward,Amy 01/11/2013 10:08:47 AM > Aroura Vasudevan,JAY 01/11/2013 10:22:42 AM > sinus bradycardia, NSST, LVH, EC Echocardiogram (Ordered for 01/11/2013)  No angina. CABG 8+ years ago.  2. Essential hypertension, benign  Refill OTC, Tumeric, daily, PRN Continue Lisinopril Tablet, 5mg , 1 tablet, Orally, qd BP increased at last few checks. Check BP at home, controlled there recently.  3. Mixed hyperlipidemia  Continue Crestor Tablet, 10 MG, 1 tablet, Orally, once a week- spoke about statins at length.  He thinks tendon issues are related to statins. OK with restarting  Lovaza Capsule, 1 GM, TAKE 2 CAPSULES BY MOUTH DAILY- this was stopped in the past.  He will discuss with Riki RuskJeremy.  Will restart things one at a time.  LDL 90. He is frustrated that his lipids do not seem to correlate to dietary efforts or exercise. We spoke about the arthritis and his perceived interaction with lipid lowering therapy.   Signed, Fredric MareJay S. Zaylei Mullane, MD, [facility]rokeFACC 05/09/2014 8:21 AM

## 2014-05-05 NOTE — Patient Instructions (Signed)
Your physician recommends that you continue on your current medications as directed. Please refer to the Current Medication list given to you today.  Your physician wants you to follow-up in: 1 year with Dr. Varanasi. You will receive a reminder letter in the mail two months in advance. If you don't receive a letter, please call our office to schedule the follow-up appointment.  

## 2014-05-09 ENCOUNTER — Encounter: Payer: Self-pay | Admitting: Interventional Cardiology

## 2014-05-09 DIAGNOSIS — I2510 Atherosclerotic heart disease of native coronary artery without angina pectoris: Secondary | ICD-10-CM | POA: Insufficient documentation

## 2014-05-09 DIAGNOSIS — I10 Essential (primary) hypertension: Secondary | ICD-10-CM | POA: Insufficient documentation

## 2014-06-23 ENCOUNTER — Telehealth: Payer: Self-pay | Admitting: Pharmacist

## 2014-06-23 MED ORDER — AMBULATORY NON FORMULARY MEDICATION: 75.0000 mg | Status: DC

## 2014-06-23 NOTE — Telephone Encounter (Signed)
Patient came in today to discuss Praluent use given his h/o CABG and inability to tolerate daily statin.   Cardiac risk factors CAD (CABG 2006), age.  Target LDL < 70 ; LDL-P < 1000.  Target non-HDL <100.  Medications currently taking: Crestor 10 mg qweek.  Intolerance: Crestor qd and biw (joint aches), Lipitor daily (joint aches), Zetia (myalgias and sedation), OTC fish oil (dyspepsia, and he felt it worsened his tendon thickening). Non-compliant with: Plant stanols (Benecol chews) - just forgets to take them. His TG and small LDL-P have gone up since being off niaspan, and he doesn't want to go back on this. Niacin he felt was exacerbating hand/finger pain, so stopped this.  Patient agreeable to Praluent 75 mg SQ q 2 weeks.  Patient counseled on Praluent use, storage, and administration.  Samples x 1 month given, and paperwork filled out and faxed to [employer].  He will f/u in six weeks to get NMR / hepatic panel, and see me a few days later. He has [employer] insurance.  He will call if he has any problems or questions.    To Dr. Eldridge DaceVaranasi as Lorain ChildesFYI.

## 2014-08-01 ENCOUNTER — Other Ambulatory Visit (INDEPENDENT_AMBULATORY_CARE_PROVIDER_SITE_OTHER): Payer: BC Managed Care – PPO

## 2014-08-01 DIAGNOSIS — E785 Hyperlipidemia, unspecified: Secondary | ICD-10-CM

## 2014-08-01 DIAGNOSIS — Z79899 Other long term (current) drug therapy: Secondary | ICD-10-CM

## 2014-08-01 LAB — HEPATIC FUNCTION PANEL
ALBUMIN: 4.2 g/dL (ref 3.5–5.2)
ALT: 33 U/L (ref 0–53)
AST: 29 U/L (ref 0–37)
Alkaline Phosphatase: 39 U/L (ref 39–117)
Bilirubin, Direct: 0.1 mg/dL (ref 0.0–0.3)
TOTAL PROTEIN: 7.2 g/dL (ref 6.0–8.3)
Total Bilirubin: 1.2 mg/dL (ref 0.2–1.2)

## 2014-08-03 LAB — NMR LIPOPROFILE WITH LIPIDS
Cholesterol, Total: 139 mg/dL (ref 100–199)
HDL Particle Number: 32.8 umol/L (ref >=30.5)
HDL SIZE: 8.8 nm — AB (ref >=9.2)
HDL-C: 35 mg/dL — AB (ref >39)
LARGE HDL: 2.8 umol/L — AB (ref >=4.8)
LARGE VLDL-P: 3.5 nmol/L — AB (ref <=2.7)
LDL (calc): 71 mg/dL (ref 0–99)
LDL Particle Number: 856 nmol/L (ref <1000)
LDL SIZE: 19.7 nm (ref >=20.8)
LP-IR Score: 56 — ABNORMAL HIGH (ref <=45)
Small LDL Particle Number: 685 nmol/L — ABNORMAL HIGH (ref <=527)
Triglycerides: 165 mg/dL — ABNORMAL HIGH (ref 0–149)
VLDL Size: 42.9 nm (ref <=46.6)

## 2014-08-04 ENCOUNTER — Ambulatory Visit (INDEPENDENT_AMBULATORY_CARE_PROVIDER_SITE_OTHER): Payer: BC Managed Care – PPO | Admitting: Pharmacist

## 2014-08-04 DIAGNOSIS — Z79899 Other long term (current) drug therapy: Secondary | ICD-10-CM

## 2014-08-04 DIAGNOSIS — E785 Hyperlipidemia, unspecified: Secondary | ICD-10-CM

## 2014-08-04 MED ORDER — ROSUVASTATIN CALCIUM 10 MG PO TABS: 10.0000 mg | ORAL_TABLET | ORAL | Status: AC

## 2014-08-04 NOTE — Patient Instructions (Signed)
1.  Continue Crestor 10 mg once weekly. 2.  Continue Praluent 75 mg twice monthly.   3.  Will re-evaluate the need for 150 mg dose in 4 months after approval should be settled with [employer]. 4.  Recheck NMR LipoProfile and liver function (11/28/14- fasting labs), and see Kennon Rounds on 18/16 at 4:00 pm

## 2014-08-04 NOTE — Assessment & Plan Note (Signed)
Explained to patient that peak LDL reduction of Praluent should occur sometime b/t now and 2 more weeks from now.  Will not increase dose to 150 mg q 2 weeks unless LDL remains > 70 mg/dL in 4 more months from now.  Will hopefully have insurance coverage straightened out in 4 months, and for now he will continue to receive Praluent through the [employer] with [employer].  Patient is excited that he has been able to tolerate this so well, and that his cholesterol levels are finally down without causing severe joint pain.  He will continue active lifestyle, continue Crestor 10 mg qweek, and continue Praluent 75 mg SQ q 2 weeks and see pharmacist in 4 months after his NMR / LFTs.  He will call if any problems.

## 2014-08-04 NOTE — Progress Notes (Signed)
Patient here for followup of h/o elevated LDL-P and early CAD. He added Praluent 75 mg q 2 weeks to his weekly Crestor 10 mg five weeks ago, and his LDL has dropped to 71 mg/dL and LDL-P to 161 nmol/L.  Patient is tolerating regimen very well, and is thrilled to his his cholesterol values are down significantly.  He asks about the need to increase to 150 mg of Praluent. Patient has been enrolled into the [facility] and is receiving medication from the [place] while [employer]BS figures out coverage.  Patient previously struggled to tolerate Lipitor and Crestor due to tendon pain and weakness, especially in his hands. Patient only on Crestor 10 mg qweek since 12/2012.  Patient was being treated by rheumatology for psoriatic arthritis, and pain was worsened by statins.  Zetia caused him to become extremely sedated and joints to ache, so he stopped this on his own recently.  He tried Welchol, but had to stop due to severe GI upset and constipation.  Tried both powder and pill form of Welchol.   Cardiac risk factors CAD (CABG 2006), age.  Target LDL < 70 ; LDL-P < 1000.  Target non-HDL <100.  Medications currently taking: Praluent 75 mg q 2 weeks, Crestor 10 mg qweek. Intolerance:  Crestor qd and biw (joint aches), Lipitor daily (joint aches), Zetia (myalgias and sedation), OTC fish oil (dyspepsia, and he felt it worsened his tendon thickening), Welchol (GI upset and constipation); Non-compliant with: Plant stanols (Benecol chews) - just forgets to take them. His TG and small LDL-P have gone up since being off niaspan, and he doesn't want to go back on this. Niacin he felt was exacerbating hand/finger pain, so stopped this.  Diet patient living on the road and his diet has been erratic.  Exercise:  He is hiking or riding his bike a few days of the week. Labs LDL-P went from 1150 up to 856, and LDL drown from 119 to 71 since adding Praluent 75 mg q 2 weeks just 5 weeks ago.  Still on Crestor 10 mg once  weekly and tolerating.  Unable to tolerate higher statin dose than this. Not interested in going back on fish oil, zetia, niacin, or welchol as failed these in the past.  Labs: 07/2014:  LDL-P number 856, LDL 71, HDL 35, TG 165, TC 139, LFTs normal (Praluent 75 mg q 2 weeks + Crestor 10 mg qweek) 03/2014:  LDL-P number 1146, LDL 119, TC 200, TG 240, HDL 33, LFTs normal (Crestor 10 mg qweek).  His LDL-P number has historically been higher than this on this regimen.  Current Outpatient Prescriptions  Medication Sig Dispense Refill  . Alirocumab (PRALUENT) 75 MG/ML SOPN Inject 75 mg into the skin every 14 (fourteen) days.      Marland Kitchen aspirin 81 MG tablet Take 81 mg by mouth daily.      . fexofenadine (ALLEGRA) 60 MG tablet Take 60 mg by mouth daily.      Marland Kitchen lisinopril (PRINIVIL,ZESTRIL) 5 MG tablet Take 1 tablet (5 mg total) by mouth daily.  90 tablet  2  . rosuvastatin (CRESTOR) 10 MG tablet Take 10 mg by mouth 7 days.       No current facility-administered medications for this visit.   Allergies  Allergen Reactions  . Crestor [Rosuvastatin]     Joint and tendon pain when taking daily or twice weekly  . Fish Oil     Dyspepsia   . Lipitor [Atorvastatin]  Severe joint and tendon pain  . Niacin And Related     Joint pain in hands  . Welchol [Colesevelam Hcl]     GI upset and constipation  . Zetia [Ezetimibe]     sedation   .famhx

## 2014-08-16 ENCOUNTER — Telehealth: Payer: Self-pay | Admitting: Pharmacist

## 2014-08-16 NOTE — Telephone Encounter (Signed)
Correction, patient has been approved for Praluent 75 mg (previously stated he was in bridge program).  Has been approved for 6 months by [employer], and [facility] has shipped product already.

## 2014-09-28 ENCOUNTER — Other Ambulatory Visit: Payer: BC Managed Care – PPO

## 2014-10-05 ENCOUNTER — Ambulatory Visit: Payer: BC Managed Care – PPO | Admitting: Pharmacist

## 2014-10-22 ENCOUNTER — Other Ambulatory Visit: Payer: Self-pay | Admitting: Interventional Cardiology

## 2014-11-28 ENCOUNTER — Other Ambulatory Visit (INDEPENDENT_AMBULATORY_CARE_PROVIDER_SITE_OTHER): Payer: BLUE CROSS/BLUE SHIELD | Admitting: *Deleted

## 2014-11-28 DIAGNOSIS — E785 Hyperlipidemia, unspecified: Secondary | ICD-10-CM

## 2014-11-28 DIAGNOSIS — Z79899 Other long term (current) drug therapy: Secondary | ICD-10-CM

## 2014-11-28 LAB — HEPATIC FUNCTION PANEL
ALBUMIN: 4.2 g/dL (ref 3.5–5.2)
ALK PHOS: 48 U/L (ref 39–117)
ALT: 24 U/L (ref 0–53)
AST: 24 U/L (ref 0–37)
BILIRUBIN DIRECT: 0 mg/dL (ref 0.0–0.3)
BILIRUBIN TOTAL: 0.9 mg/dL (ref 0.2–1.2)
TOTAL PROTEIN: 7 g/dL (ref 6.0–8.3)

## 2014-12-01 ENCOUNTER — Ambulatory Visit: Payer: BC Managed Care – PPO | Admitting: Pharmacist

## 2014-12-04 LAB — NMR LIPOPROFILE WITH LIPIDS

## 2014-12-05 ENCOUNTER — Other Ambulatory Visit: Payer: BLUE CROSS/BLUE SHIELD | Admitting: *Deleted

## 2014-12-07 ENCOUNTER — Other Ambulatory Visit: Payer: Self-pay | Admitting: Pharmacist

## 2014-12-07 DIAGNOSIS — E785 Hyperlipidemia, unspecified: Secondary | ICD-10-CM

## 2014-12-07 LAB — NMR LIPOPROFILE WITH LIPIDS
Cholesterol, Total: 152 mg/dL (ref 100–199)
HDL Particle Number: 35.3 umol/L (ref >=30.5)
HDL Size: 8.5 nm — ABNORMAL LOW (ref >=9.2)
HDL-C: 44 mg/dL (ref >39)
LARGE VLDL-P: 1.2 nmol/L (ref <=2.7)
LDL CALC: 92 mg/dL (ref 0–99)
LDL Particle Number: 1022 nmol/L — ABNORMAL HIGH (ref <1000)
LDL Size: 20.6 nm (ref >=20.8)
LP-IR SCORE: 50 — AB (ref <=45)
Large HDL-P: 2.2 umol/L — ABNORMAL LOW (ref >=4.8)
SMALL LDL PARTICLE NUMBER: 564 nmol/L — AB (ref <=527)
TRIGLYCERIDES: 81 mg/dL (ref 0–149)
VLDL Size: 37.4 nm (ref <=46.6)

## 2014-12-07 MED ORDER — ALIROCUMAB 150 MG/ML ~~LOC~~ SOPN: 150.0000 mg | PEN_INJECTOR | SUBCUTANEOUS | Status: DC

## 2014-12-22 ENCOUNTER — Other Ambulatory Visit: Payer: Self-pay

## 2014-12-22 MED ORDER — LISINOPRIL 5 MG PO TABS: 5.0000 mg | ORAL_TABLET | Freq: Every day | ORAL | Status: DC

## 2014-12-25 ENCOUNTER — Telehealth: Payer: Self-pay | Admitting: Interventional Cardiology

## 2014-12-25 NOTE — Telephone Encounter (Signed)
New message      Pt need praluent 150mg  called in to [facility] mail order [facility].  They sent him 75mg  again.  If there is a problem, please call pt

## 2015-01-04 MED ORDER — ALIROCUMAB 150 MG/ML ~~LOC~~ SOPN: 150.0000 mg | PEN_INJECTOR | SUBCUTANEOUS | Status: DC

## 2015-01-04 NOTE — Telephone Encounter (Signed)
New Rx sent to [facility]

## 2015-03-22 ENCOUNTER — Other Ambulatory Visit: Payer: Self-pay | Admitting: Pharmacist

## 2015-03-22 DIAGNOSIS — E785 Hyperlipidemia, unspecified: Secondary | ICD-10-CM

## 2015-03-30 ENCOUNTER — Other Ambulatory Visit (INDEPENDENT_AMBULATORY_CARE_PROVIDER_SITE_OTHER): Payer: BLUE CROSS/BLUE SHIELD | Admitting: *Deleted

## 2015-03-30 DIAGNOSIS — E785 Hyperlipidemia, unspecified: Secondary | ICD-10-CM | POA: Diagnosis not present

## 2015-03-30 LAB — HEPATIC FUNCTION PANEL
ALBUMIN: 4.1 g/dL (ref 3.5–5.2)
ALK PHOS: 42 U/L (ref 39–117)
ALT: 23 U/L (ref 0–53)
AST: 19 U/L (ref 0–37)
BILIRUBIN DIRECT: 0.2 mg/dL (ref 0.0–0.3)
BILIRUBIN TOTAL: 0.9 mg/dL (ref 0.2–1.2)
Total Protein: 7 g/dL (ref 6.0–8.3)

## 2015-04-03 LAB — NMR LIPOPROFILE WITH LIPIDS
CHOLESTEROL, TOTAL: 136 mg/dL (ref 100–199)
HDL PARTICLE NUMBER: 31.3 umol/L (ref >=30.5)
HDL Size: 8.6 nm — ABNORMAL LOW (ref >=9.2)
HDL-C: 34 mg/dL — AB (ref >39)
LARGE HDL: 1.3 umol/L — AB (ref >=4.8)
LDL CALC: 54 mg/dL (ref 0–99)
LDL PARTICLE NUMBER: 976 nmol/L (ref <1000)
LDL SIZE: 19.4 nm (ref >=20.8)
LP-IR Score: 67 — ABNORMAL HIGH (ref <=45)
Large VLDL-P: 2.6 nmol/L (ref <=2.7)
Small LDL Particle Number: 858 nmol/L — ABNORMAL HIGH (ref <=527)
Triglycerides: 240 mg/dL — ABNORMAL HIGH (ref 0–149)
VLDL Size: 45.5 nm (ref <=46.6)

## 2015-04-05 ENCOUNTER — Other Ambulatory Visit: Payer: Self-pay | Admitting: *Deleted

## 2015-04-05 DIAGNOSIS — E785 Hyperlipidemia, unspecified: Secondary | ICD-10-CM

## 2015-04-09 ENCOUNTER — Encounter (HOSPITAL_BASED_OUTPATIENT_CLINIC_OR_DEPARTMENT_OTHER): Payer: Self-pay | Admitting: *Deleted

## 2015-04-09 ENCOUNTER — Encounter (HOSPITAL_BASED_OUTPATIENT_CLINIC_OR_DEPARTMENT_OTHER)
Admission: RE | Admit: 2015-04-09 | Discharge: 2015-04-09 | Disposition: A | Discharge status: Home / Self Care | Payer: BLUE CROSS/BLUE SHIELD | Source: Ambulatory Visit | Attending: Orthopedic Surgery | Admitting: Orthopedic Surgery

## 2015-04-09 DIAGNOSIS — M65332 Trigger finger, left middle finger: Secondary | ICD-10-CM | POA: Diagnosis present

## 2015-04-09 DIAGNOSIS — K219 Gastro-esophageal reflux disease without esophagitis: Secondary | ICD-10-CM | POA: Diagnosis not present

## 2015-04-09 DIAGNOSIS — G4730 Sleep apnea, unspecified: Secondary | ICD-10-CM | POA: Diagnosis not present

## 2015-04-09 DIAGNOSIS — I2510 Atherosclerotic heart disease of native coronary artery without angina pectoris: Secondary | ICD-10-CM | POA: Diagnosis not present

## 2015-04-09 DIAGNOSIS — M65842 Other synovitis and tenosynovitis, left hand: Secondary | ICD-10-CM | POA: Diagnosis not present

## 2015-04-09 DIAGNOSIS — Z79899 Other long term (current) drug therapy: Secondary | ICD-10-CM | POA: Diagnosis not present

## 2015-04-09 DIAGNOSIS — L4050 Arthropathic psoriasis, unspecified: Secondary | ICD-10-CM | POA: Diagnosis not present

## 2015-04-09 DIAGNOSIS — I10 Essential (primary) hypertension: Secondary | ICD-10-CM | POA: Diagnosis not present

## 2015-04-09 DIAGNOSIS — Z951 Presence of aortocoronary bypass graft: Secondary | ICD-10-CM | POA: Diagnosis not present

## 2015-04-09 DIAGNOSIS — M65322 Trigger finger, left index finger: Secondary | ICD-10-CM | POA: Diagnosis not present

## 2015-04-09 DIAGNOSIS — E785 Hyperlipidemia, unspecified: Secondary | ICD-10-CM | POA: Diagnosis not present

## 2015-04-09 DIAGNOSIS — M1990 Unspecified osteoarthritis, unspecified site: Secondary | ICD-10-CM | POA: Diagnosis not present

## 2015-04-09 DIAGNOSIS — Z7982 Long term (current) use of aspirin: Secondary | ICD-10-CM | POA: Diagnosis not present

## 2015-04-09 DIAGNOSIS — Z9989 Dependence on other enabling machines and devices: Secondary | ICD-10-CM | POA: Diagnosis not present

## 2015-04-09 DIAGNOSIS — Z9889 Other specified postprocedural states: Secondary | ICD-10-CM | POA: Diagnosis not present

## 2015-04-09 LAB — BASIC METABOLIC PANEL
Anion gap: 8 (ref 5–15)
BUN: 14 mg/dL (ref 6–20)
CO2: 28 mmol/L (ref 22–32)
Calcium: 9.4 mg/dL (ref 8.9–10.3)
Chloride: 104 mmol/L (ref 101–111)
Creatinine, Ser: 1.06 mg/dL (ref 0.61–1.24)
GLUCOSE: 104 mg/dL — AB (ref 65–99)
Potassium: 3.8 mmol/L (ref 3.5–5.1)
Sodium: 140 mmol/L (ref 135–145)

## 2015-04-10 ENCOUNTER — Other Ambulatory Visit: Payer: Self-pay | Admitting: Orthopedic Surgery

## 2015-04-12 ENCOUNTER — Encounter (HOSPITAL_BASED_OUTPATIENT_CLINIC_OR_DEPARTMENT_OTHER): Payer: Self-pay

## 2015-04-12 ENCOUNTER — Encounter (HOSPITAL_BASED_OUTPATIENT_CLINIC_OR_DEPARTMENT_OTHER)
Admission: RE | Disposition: A | Discharge status: Home / Self Care | Payer: Self-pay | Source: Ambulatory Visit | Attending: Orthopedic Surgery

## 2015-04-12 ENCOUNTER — Ambulatory Visit (HOSPITAL_BASED_OUTPATIENT_CLINIC_OR_DEPARTMENT_OTHER): Payer: BLUE CROSS/BLUE SHIELD | Admitting: Certified Registered"

## 2015-04-12 ENCOUNTER — Ambulatory Visit (HOSPITAL_BASED_OUTPATIENT_CLINIC_OR_DEPARTMENT_OTHER)
Admission: RE | Admit: 2015-04-12 | Discharge: 2015-04-12 | Disposition: A | Discharge status: Home / Self Care | Payer: BLUE CROSS/BLUE SHIELD | Source: Ambulatory Visit | Attending: Orthopedic Surgery | Admitting: Orthopedic Surgery

## 2015-04-12 DIAGNOSIS — M65322 Trigger finger, left index finger: Secondary | ICD-10-CM | POA: Insufficient documentation

## 2015-04-12 DIAGNOSIS — G4730 Sleep apnea, unspecified: Secondary | ICD-10-CM | POA: Insufficient documentation

## 2015-04-12 DIAGNOSIS — M1990 Unspecified osteoarthritis, unspecified site: Secondary | ICD-10-CM | POA: Insufficient documentation

## 2015-04-12 DIAGNOSIS — Z9989 Dependence on other enabling machines and devices: Secondary | ICD-10-CM | POA: Insufficient documentation

## 2015-04-12 DIAGNOSIS — E785 Hyperlipidemia, unspecified: Secondary | ICD-10-CM | POA: Insufficient documentation

## 2015-04-12 DIAGNOSIS — K219 Gastro-esophageal reflux disease without esophagitis: Secondary | ICD-10-CM | POA: Insufficient documentation

## 2015-04-12 DIAGNOSIS — Z951 Presence of aortocoronary bypass graft: Secondary | ICD-10-CM | POA: Insufficient documentation

## 2015-04-12 DIAGNOSIS — M65332 Trigger finger, left middle finger: Secondary | ICD-10-CM | POA: Insufficient documentation

## 2015-04-12 DIAGNOSIS — I10 Essential (primary) hypertension: Secondary | ICD-10-CM | POA: Insufficient documentation

## 2015-04-12 DIAGNOSIS — Z9889 Other specified postprocedural states: Secondary | ICD-10-CM | POA: Insufficient documentation

## 2015-04-12 DIAGNOSIS — I2510 Atherosclerotic heart disease of native coronary artery without angina pectoris: Secondary | ICD-10-CM | POA: Insufficient documentation

## 2015-04-12 DIAGNOSIS — Z79899 Other long term (current) drug therapy: Secondary | ICD-10-CM | POA: Insufficient documentation

## 2015-04-12 DIAGNOSIS — M65842 Other synovitis and tenosynovitis, left hand: Secondary | ICD-10-CM | POA: Insufficient documentation

## 2015-04-12 DIAGNOSIS — L4050 Arthropathic psoriasis, unspecified: Secondary | ICD-10-CM | POA: Insufficient documentation

## 2015-04-12 DIAGNOSIS — Z7982 Long term (current) use of aspirin: Secondary | ICD-10-CM | POA: Insufficient documentation

## 2015-04-12 HISTORY — PX: TRIGGER FINGER RELEASE: SHX641

## 2015-04-12 HISTORY — DX: Nausea with vomiting, unspecified: Z98.890

## 2015-04-12 HISTORY — DX: Nausea with vomiting, unspecified: R11.2

## 2015-04-12 HISTORY — PX: TENDON REPAIR: SHX5111

## 2015-04-12 LAB — POCT HEMOGLOBIN-HEMACUE: HEMOGLOBIN: 15 g/dL (ref 13.0–17.0)

## 2015-04-12 SURGERY — RELEASE, A1 PULLEY, FOR TRIGGER FINGER
Anesthesia: Monitor Anesthesia Care | Site: Finger | Laterality: Left

## 2015-04-12 MED ORDER — HYDROMORPHONE HCL 1 MG/ML IJ SOLN: 0.2500 mg | INTRAMUSCULAR | Status: DC | PRN

## 2015-04-12 MED ORDER — FENTANYL CITRATE (PF) 100 MCG/2ML IJ SOLN
INTRAMUSCULAR | Status: AC
  Filled 2015-04-12: qty 4

## 2015-04-12 MED ORDER — LIDOCAINE HCL (CARDIAC) 20 MG/ML IV SOLN
INTRAVENOUS | Status: DC | PRN
  Administered 2015-04-12: 50 mg via INTRAVENOUS

## 2015-04-12 MED ORDER — BUPIVACAINE HCL (PF) 0.5 % IJ SOLN
INTRAMUSCULAR | Status: AC
  Filled 2015-04-12: qty 30

## 2015-04-12 MED ORDER — MIDAZOLAM HCL 2 MG/2ML IJ SOLN
INTRAMUSCULAR | Status: AC
  Filled 2015-04-12: qty 2

## 2015-04-12 MED ORDER — LACTATED RINGERS IV SOLN
INTRAVENOUS | Status: DC
  Administered 2015-04-12: 10:00:00 via INTRAVENOUS

## 2015-04-12 MED ORDER — CHLORHEXIDINE GLUCONATE 4 % EX LIQD: 60.0000 mL | Freq: Once | CUTANEOUS | Status: DC

## 2015-04-12 MED ORDER — CEFAZOLIN SODIUM-DEXTROSE 2-3 GM-% IV SOLR
2.0000 g | INTRAVENOUS | Status: AC
Start: 2015-04-12 — End: 2015-04-12
  Administered 2015-04-12: 2 g via INTRAVENOUS

## 2015-04-12 MED ORDER — MIDAZOLAM HCL 2 MG/2ML IJ SOLN
1.0000 mg | INTRAMUSCULAR | Status: DC | PRN
Start: 2015-04-12 — End: 2015-04-12
  Administered 2015-04-12: 2 mg via INTRAVENOUS

## 2015-04-12 MED ORDER — ONDANSETRON HCL 4 MG/2ML IJ SOLN
INTRAMUSCULAR | Status: DC | PRN
  Administered 2015-04-12: 4 mg via INTRAVENOUS

## 2015-04-12 MED ORDER — BUPIVACAINE HCL (PF) 0.25 % IJ SOLN
INTRAMUSCULAR | Status: DC | PRN
  Administered 2015-04-12: 9 mL

## 2015-04-12 MED ORDER — CEFAZOLIN SODIUM-DEXTROSE 2-3 GM-% IV SOLR
INTRAVENOUS | Status: AC
  Filled 2015-04-12: qty 50

## 2015-04-12 MED ORDER — PROPOFOL INFUSION 10 MG/ML OPTIME
INTRAVENOUS | Status: DC | PRN
  Administered 2015-04-12: 100 ug/kg/min via INTRAVENOUS

## 2015-04-12 MED ORDER — GLYCOPYRROLATE 0.2 MG/ML IJ SOLN: 0.2000 mg | Freq: Once | INTRAMUSCULAR | Status: DC | PRN

## 2015-04-12 MED ORDER — BUPIVACAINE HCL (PF) 0.25 % IJ SOLN
INTRAMUSCULAR | Status: AC
  Filled 2015-04-12: qty 90

## 2015-04-12 MED ORDER — FENTANYL CITRATE (PF) 100 MCG/2ML IJ SOLN: 50.0000 ug | INTRAMUSCULAR | Status: DC | PRN

## 2015-04-12 MED ORDER — BACITRACIN ZINC 500 UNIT/GM EX OINT
TOPICAL_OINTMENT | CUTANEOUS | Status: AC
  Filled 2015-04-12: qty 28.35

## 2015-04-12 MED ORDER — FENTANYL CITRATE (PF) 100 MCG/2ML IJ SOLN
INTRAMUSCULAR | Status: DC | PRN
  Administered 2015-04-12 (×2): 50 ug via INTRAVENOUS

## 2015-04-12 MED ORDER — HYDROCODONE-ACETAMINOPHEN 5-325 MG PO TABS: ORAL_TABLET | ORAL | Status: DC

## 2015-04-12 MED ORDER — LIDOCAINE HCL (PF) 0.5 % IJ SOLN
INTRAMUSCULAR | Status: DC | PRN
  Administered 2015-04-12: 35 mL via INTRAVENOUS

## 2015-04-12 SURGICAL SUPPLY — 82 items
BANDAGE COBAN STERILE 2 (GAUZE/BANDAGES/DRESSINGS) ×3 IMPLANT
BANDAGE ELASTIC 3 VELCRO ST LF (GAUZE/BANDAGES/DRESSINGS) IMPLANT
BLADE MINI RND TIP GREEN BEAV (BLADE) IMPLANT
BLADE SURG 15 STRL LF DISP TIS (BLADE) ×2 IMPLANT
BLADE SURG 15 STRL SS (BLADE) ×4
BNDG CONFORM 2 STRL LF (GAUZE/BANDAGES/DRESSINGS) ×3 IMPLANT
BNDG ELASTIC 2 VLCR STRL LF (GAUZE/BANDAGES/DRESSINGS) IMPLANT
BNDG ESMARK 4X9 LF (GAUZE/BANDAGES/DRESSINGS) ×3 IMPLANT
BNDG GAUZE ELAST 4 BULKY (GAUZE/BANDAGES/DRESSINGS) IMPLANT
CHLORAPREP W/TINT 26ML (MISCELLANEOUS) ×3 IMPLANT
CORDS BIPOLAR (ELECTRODE) ×3 IMPLANT
COTTONBALL LRG STERILE PKG (GAUZE/BANDAGES/DRESSINGS) IMPLANT
COVER BACK TABLE 60X90IN (DRAPES) ×3 IMPLANT
COVER MAYO STAND STRL (DRAPES) ×3 IMPLANT
CUFF TOURNIQUET SINGLE 18IN (TOURNIQUET CUFF) ×3 IMPLANT
DECANTER SPIKE VIAL GLASS SM (MISCELLANEOUS) ×3 IMPLANT
DRAPE EXTREMITY T 121X128X90 (DRAPE) ×3 IMPLANT
DRAPE OEC MINIVIEW 54X84 (DRAPES) IMPLANT
DRAPE SURG 17X23 STRL (DRAPES) ×3 IMPLANT
DRSG PAD ABDOMINAL 8X10 ST (GAUZE/BANDAGES/DRESSINGS) IMPLANT
GAUZE SPONGE 4X4 12PLY STRL (GAUZE/BANDAGES/DRESSINGS) ×3 IMPLANT
GAUZE SPONGE 4X4 16PLY XRAY LF (GAUZE/BANDAGES/DRESSINGS) IMPLANT
GAUZE XEROFORM 1X8 LF (GAUZE/BANDAGES/DRESSINGS) ×3 IMPLANT
GLOVE BIO SURGEON STRL SZ 6.5 (GLOVE) ×4 IMPLANT
GLOVE BIO SURGEON STRL SZ7.5 (GLOVE) ×3 IMPLANT
GLOVE BIO SURGEONS STRL SZ 6.5 (GLOVE) ×2
GLOVE BIOGEL PI IND STRL 7.0 (GLOVE) ×3 IMPLANT
GLOVE BIOGEL PI IND STRL 8 (GLOVE) ×1 IMPLANT
GLOVE BIOGEL PI INDICATOR 7.0 (GLOVE) ×6
GLOVE BIOGEL PI INDICATOR 8 (GLOVE) ×2
GLOVE SURG ORTHO 8.0 STRL STRW (GLOVE) IMPLANT
GOWN STRL REUS W/ TWL LRG LVL3 (GOWN DISPOSABLE) ×2 IMPLANT
GOWN STRL REUS W/TWL LRG LVL3 (GOWN DISPOSABLE) ×4
GOWN STRL REUS W/TWL XL LVL3 (GOWN DISPOSABLE) ×3 IMPLANT
K-WIRE .035X4 (WIRE) IMPLANT
LOOP VESSEL MAXI BLUE (MISCELLANEOUS) IMPLANT
NEEDLE HYPO 25X1 1.5 SAFETY (NEEDLE) ×3 IMPLANT
NEEDLE KEITH (NEEDLE) IMPLANT
NS IRRIG 1000ML POUR BTL (IV SOLUTION) ×3 IMPLANT
PACK BASIN DAY SURGERY FS (CUSTOM PROCEDURE TRAY) ×3 IMPLANT
PAD CAST 3X4 CTTN HI CHSV (CAST SUPPLIES) ×1 IMPLANT
PAD CAST 4YDX4 CTTN HI CHSV (CAST SUPPLIES) IMPLANT
PADDING CAST ABS 3INX4YD NS (CAST SUPPLIES)
PADDING CAST ABS 4INX4YD NS (CAST SUPPLIES)
PADDING CAST ABS COTTON 3X4 (CAST SUPPLIES) IMPLANT
PADDING CAST ABS COTTON 4X4 ST (CAST SUPPLIES) IMPLANT
PADDING CAST COTTON 3X4 STRL (CAST SUPPLIES) ×2
PADDING CAST COTTON 4X4 STRL (CAST SUPPLIES)
SLEEVE SCD COMPRESS KNEE MED (MISCELLANEOUS) IMPLANT
SPLINT PLASTER CAST XFAST 3X15 (CAST SUPPLIES) IMPLANT
SPLINT PLASTER XTRA FASTSET 3X (CAST SUPPLIES)
STOCKINETTE 4X48 STRL (DRAPES) ×3 IMPLANT
SUT CHROMIC 5 0 P 3 (SUTURE) IMPLANT
SUT ETHIBOND 3-0 V-5 (SUTURE) IMPLANT
SUT ETHILON 3 0 PS 1 (SUTURE) IMPLANT
SUT ETHILON 4 0 PS 2 18 (SUTURE) ×3 IMPLANT
SUT FIBERWIRE 3-0 18 TAPR NDL (SUTURE)
SUT FIBERWIRE 4-0 18 DIAM BLUE (SUTURE)
SUT MERSILENE 2.0 SH NDLE (SUTURE) IMPLANT
SUT MERSILENE 3 0 FS 1 (SUTURE) IMPLANT
SUT MERSILENE 4 0 P 3 (SUTURE) IMPLANT
SUT MNCRL AB 4-0 PS2 18 (SUTURE) IMPLANT
SUT MON AB 5-0 PS2 18 (SUTURE) IMPLANT
SUT POLY BUTTON 15MM (SUTURE) IMPLANT
SUT PROLENE 2 0 SH DA (SUTURE) IMPLANT
SUT PROLENE 6 0 P 1 18 (SUTURE) IMPLANT
SUT SILK 2 0 FS (SUTURE) IMPLANT
SUT SILK 4 0 PS 2 (SUTURE) IMPLANT
SUT STEEL 4 0 V 26 (SUTURE) IMPLANT
SUT SUPRAMID 4-0 (SUTURE) IMPLANT
SUT VIC AB 3-0 PS1 18 (SUTURE)
SUT VIC AB 3-0 PS1 18XBRD (SUTURE) IMPLANT
SUT VIC AB 4-0 P-3 18XBRD (SUTURE) IMPLANT
SUT VIC AB 4-0 P3 18 (SUTURE)
SUT VICRYL 4-0 PS2 18IN ABS (SUTURE) IMPLANT
SUTURE FIBERWR 3-0 18 TAPR NDL (SUTURE) IMPLANT
SUTURE FIBERWR 4-0 18 DIA BLUE (SUTURE) IMPLANT
SYR BULB 3OZ (MISCELLANEOUS) ×3 IMPLANT
SYR CONTROL 10ML LL (SYRINGE) ×3 IMPLANT
TOWEL OR 17X24 6PK STRL BLUE (TOWEL DISPOSABLE) ×6 IMPLANT
TUBE FEEDING 5FR 15 INCH (TUBING) IMPLANT
UNDERPAD 30X30 (UNDERPADS AND DIAPERS) ×3 IMPLANT

## 2015-04-12 NOTE — Anesthesia Preprocedure Evaluation (Addendum)
Anesthesia Evaluation  Patient identified by MRN, date of birth, ID band Patient awake    Reviewed: Allergy & Precautions, H&P , NPO status , Patient's Chart, lab work & pertinent test results  History of Anesthesia Complications (+) PONV  Airway Mallampati: II  TM Distance: >3 FB Neck ROM: Full    Dental no notable dental hx. (+) Teeth Intact, Dental Advisory Given   Pulmonary neg pulmonary ROS, sleep apnea and Continuous Positive Airway Pressure Ventilation ,  breath sounds clear to auscultation  Pulmonary exam normal       Cardiovascular hypertension, Pt. on medications + CAD Rhythm:Regular Rate:Normal + Systolic murmurs    Neuro/Psych negative neurological ROS  negative psych ROS   GI/Hepatic Neg liver ROS, GERD-  Medicated and Controlled,  Endo/Other  negative endocrine ROS  Renal/GU negative Renal ROS  negative genitourinary   Musculoskeletal  (+) Arthritis -, Osteoarthritis,    Abdominal   Peds  Hematology negative hematology ROS (+)   Anesthesia Other Findings   Reproductive/Obstetrics negative OB ROS                            Anesthesia Physical Anesthesia Plan  ASA: III  Anesthesia Plan: MAC and Bier Block   Post-op Pain Management:    Induction: Intravenous  Airway Management Planned: Simple Face Mask  Additional Equipment:   Intra-op Plan:   Post-operative Plan:   Informed Consent: I have reviewed the patients History and Physical, chart, labs and discussed the procedure including the risks, benefits and alternatives for the proposed anesthesia with the patient or authorized representative who has indicated his/her understanding and acceptance.   Dental advisory given  Plan Discussed with: CRNA  Anesthesia Plan Comments:         Anesthesia Quick Evaluation

## 2015-04-12 NOTE — Op Note (Signed)
[id number] 

## 2015-04-12 NOTE — Brief Op Note (Signed)
04/12/2015  11:36 AM  PATIENT:  [name]  59 y.o. male  PRE-OPERATIVE DIAGNOSIS:  LEFT LONG TRIGGER DIGIT INDEX TRIGGER  POST-OPERATIVE DIAGNOSIS:  TRIGGER FINGER INDEX AND LONG  PROCEDURE:  Procedure(s): LEFT LONG  INDEX TRIGGER RELEASE  (Left) AND TENDON DEBRIDEMENT  (Left)  SURGEON:  Surgeon(s) and Role:    * Betha LoaKevin Toshia Larkin, MD - Primary  PHYSICIAN ASSISTANT:   ASSISTANTS: none   ANESTHESIA:   Bier block with sedation  EBL:  Total I/O In: 600 [I.V.:600] Out: -   BLOOD ADMINISTERED:none  DRAINS: none   LOCAL MEDICATIONS USED:  MARCAINE     SPECIMEN:  Source of Specimen:  left index and long tenosynovium  DISPOSITION OF SPECIMEN:  PATHOLOGY  COUNTS:  YES  TOURNIQUET:   Total Tourniquet Time Documented: Forearm (Left) - 33 minutes Total: Forearm (Left) - 33 minutes   DICTATION: .Other Dictation: Dictation Number [id number]  PLAN OF CARE: Discharge to home after PACU  PATIENT DISPOSITION:  PACU - hemodynamically stable.

## 2015-04-12 NOTE — Discharge Instructions (Addendum)

## 2015-04-12 NOTE — Anesthesia Postprocedure Evaluation (Signed)
  Anesthesia Post-op Note  Patient: [name]  Procedure(s) Performed: Procedure(s): LEFT LONG  INDEX TRIGGER RELEASE  (Left) AND TENDON DEBRIDEMENT  (Left)  Patient Location: PACU  Anesthesia Type: MAC  Level of Consciousness: awake and alert   Airway and Oxygen Therapy: Patient Spontanous Breathing  Post-op Pain: none  Post-op Assessment: Post-op Vital signs reviewed, Patient's Cardiovascular Status Stable and Respiratory Function Stable  Post-op Vital Signs: Reviewed  Filed Vitals:   04/12/15 1200  BP: 148/69  Pulse: 53  Temp:   Resp: 13    Complications: No apparent anesthesia complications

## 2015-04-12 NOTE — H&P (Signed)
[name] is an 59 y.o. male.   Chief Complaint: left long and index finger trigger digits HPI: 59 yo male with history of trigger digits with pain and swelling of index and long fingers of left hand.  He has had injection of the long finger 2-3 times without relief.  He wishes to have a trigger release and tendon debridement.  He has also noted more swelling and pain in index finger in same area that feels like his trigger digits have in the past.  He wishes to have a release of the left index finger as well.  Past Medical History  Diagnosis Date  . Coronary artery disease   . Hypertension   . Hyperlipemia   . GERD (gastroesophageal reflux disease)   . Sleep apnea     snores-on apnea  . Snores   . Heart murmur   . Arthritis     psoriatic arthritis  . PONV (postoperative nausea and vomiting)     Past Surgical History  Procedure Laterality Date  . Trigger finger release  02/10/2012    Procedure: MINOR RELEASE TRIGGER FINGER/A-1 PULLEY;  Surgeon: Wyn Forsterobert V Sypher Jr., MD;  Location: [facility];  Service: Orthopedics;  Laterality: Right;  right thumb  . Coronary artery bypass graft  2006    x5  . Shoulder arthroscopy      rtx3  . I&d extremity      rt shoulder  . Trigger finger release      lt thumb  . Appendectomy    . Trigger finger release  05/25/2012    Procedure: RELEASE TRIGGER FINGER/A-1 PULLEY;  Surgeon: Wyn Forsterobert V Sypher Jr., MD;  Location: [facility];  Service: Orthopedics;  Laterality: Right;  right long   . Scar revision  05/25/2012    Procedure: SCAR REVISION;  Surgeon: Wyn Forsterobert V Sypher Jr., MD;  Location: [facility];  Service: Orthopedics;  Laterality: Right;  deltoid diastasia repair    Family History  Problem Relation Age of Onset  . Colon cancer Father    Social History:  reports that he has never smoked. He does not have any smokeless tobacco history on file. He reports that he drinks alcohol. He reports that he  does not use illicit drugs.  Allergies:  Allergies  Allergen Reactions  . Crestor [Rosuvastatin]     Joint and tendon pain when taking daily or twice weekly  . Fish Oil     Dyspepsia   . Lipitor [Atorvastatin]     Severe joint and tendon pain  . Niacin And Related     Joint pain in hands  . Welchol [Colesevelam Hcl]     GI upset and constipation  . Zetia [Ezetimibe]     sedation    Medications Prior to Admission  Medication Sig Dispense Refill  . Golimumab 50 MG/0.5ML SOAJ Inject into the skin.    . ranitidine (ZANTAC) 150 MG tablet Take 150 mg by mouth 2 (two) times daily.    . Alirocumab (PRALUENT) 150 MG/ML SOPN Inject 150 mg into the skin every 14 (fourteen) days. 2 pen 11  . aspirin 81 MG tablet Take 81 mg by mouth daily.    . fexofenadine (ALLEGRA) 60 MG tablet Take 60 mg by mouth daily.    Marland Kitchen. lisinopril (PRINIVIL,ZESTRIL) 5 MG tablet Take 1 tablet (5 mg total) by mouth daily. 90 tablet 2  . rosuvastatin (CRESTOR) 10 MG tablet Take 1 tablet (10 mg total) by mouth  once a week. 4 tablet 11    No results found for this or any previous visit (from the past 48 hour(s)).  No results found.   A comprehensive review of systems was negative except for: Eyes: positive for contacts/glasses  Height 5\' 10"  (1.778 m), weight 89.812 kg (198 lb).  General appearance: alert, cooperative and appears stated age Head: Normocephalic, without obvious abnormality, atraumatic Neck: supple, symmetrical, trachea midline Resp: clear to auscultation bilaterally Cardio: regular rate and rhythm GI: non tender Extremities: intact sensation and capillary refill all digits.  +epl/fpl/io.  left index and long mildly swollen.  some contracture at pip joints.  ttp volar aspect mp joints.  small tendon nodules palpable. Pulses: 2+ and symmetric Skin: Skin color, texture, turgor normal. No rashes or lesions Neurologic: Grossly normal Incision/Wound: none  Assessment/Plan Left index and long  finger trigger digits.  Non operative and operative treatment options were discussed with the patient and patient wishes to proceed with operative treatment. Risks, benefits, and alternatives of surgery were discussed and the patient agrees with the plan of care. We discussed that there is not demonstrable triggering, but I believe the issue with his fingers is tendon nodules and possibly tendon abrasion.  Plan is release of triggering and debridement of tendons as necessary.  He agrees with plan of care.  Srija Southard R 04/12/2015, 9:53 AM

## 2015-04-12 NOTE — Anesthesia Procedure Notes (Signed)
Anesthesia Regional Block:  Bier block (IV Regional)  Pre-Anesthetic Checklist: ,, timeout performed, Correct Patient, Correct Site, Correct Laterality, Correct Procedure,, site marked, surgical consent,, at surgeon's request Needles:  Injection technique: Single-shot  Needle Type: Other      Needle Gauge: 20 and 20 G    Additional Needles: Bier block (IV Regional) Narrative:  Start time: 04/12/2015 11:02 AM End time: 04/12/2015 11:02 AM Injection made incrementally with aspirations every 35 mL.  Performed by: Personally   Additional Notes: Esmark wrap, neg pulse, inject slowly 35 ml pres free 0.5 % lidocaine, pt tol well. No neg s/s. VSS

## 2015-04-12 NOTE — Transfer of Care (Signed)
Immediate Anesthesia Transfer of Care Note  Patient: [name]  Procedure(s) Performed: Procedure(s): LEFT LONG  INDEX TRIGGER RELEASE  (Left) AND TENDON DEBRIDEMENT  (Left)  Patient Location: PACU  Anesthesia Type:Bier block  Level of Consciousness: awake, alert , oriented and patient cooperative  Airway & Oxygen Therapy: Patient Spontanous Breathing and Patient connected to face mask oxygen  Post-op Assessment: Report given to RN and Post -op Vital signs reviewed and stable  Post vital signs: Reviewed and stable  Last Vitals:  Filed Vitals:   04/12/15 0956  BP: 143/68  Pulse: 52  Temp: 36.8 C  Resp: 20    Complications: No apparent anesthesia complications

## 2015-04-13 ENCOUNTER — Encounter (HOSPITAL_BASED_OUTPATIENT_CLINIC_OR_DEPARTMENT_OTHER): Payer: Self-pay | Admitting: Orthopedic Surgery

## 2015-04-13 NOTE — Op Note (Signed)
NAMRosanne Sack:  Vantine, TRUAL              ACCOUNT NO.:  [id number]  MEDICAL RECORD NO.:  [id number]  LOCATION:                                FACILITY:  [facility]  PHYSICIAN:  Betha LoaKevin Sora Vrooman, MD        DATE OF BIRTH:  [birth year]  DATE OF PROCEDURE:  04/12/2015 DATE OF DISCHARGE:  04/12/2015                              OPERATIVE REPORT   PREOPERATIVE DIAGNOSIS:  Left index and long finger trigger digits with possible frayed tendon.  POSTOPERATIVE DIAGNOSIS:  Left index and long finger trigger digits with inflamed tenosynovium.  PROCEDURE:   1. Left index finger trigger digit release and tenosynovectomy 2. Left long finger trigger digit release and tenosynovectomy.  SURGEON:  Betha LoaKevin Unknown Schleyer, MD.  ASSISTANT:  None.  ANESTHESIA:  Bier block with sedation.  IV FLUIDS:  Per anesthesia flow sheet.  ESTIMATED BLOOD LOSS:  Minimal.  COMPLICATIONS:  None.  SPECIMENS:  Tenosynovium to Pathology.  TOURNIQUET TIME:  33 minutes.  DISPOSITION:  Stable to PACU.  INDICATIONS:  Mr. Troy SineBillings is a 59 year old male who has had previous triggering in multiple digits.  He has had issues of the index and long finger of the left hand.  These have been swollen and difficult to fully flex.  He has had injections to the long finger 2-3 times without resolution.  He wishes to have a trigger release and debridement of tendons for management of symptoms.  Risks, benefits, and alternatives of surgery were discussed including risk of blood loss; infection; damage to nerves, vessels, tendons, ligaments, bone; failure of surgery; need for additional surgery; complications with wound healing; continued pain; continued triggering; need for further surgery.  He voiced understanding of these risks and elected to proceed.  OPERATIVE COURSE:  After being identified preoperatively by myself, the patient and I agreed upon procedure and site of procedure.  The surgical site was marked.  The risks, benefits, and alternatives  of surgery were reviewed and he wished to proceed.  Surgical consent had been signed. He was given IV Ancef as preoperative antibiotic prophylaxis.  He was transferred to the operating room and placed on the operating room table in supine position with the left upper extremity on arm board.  Bier block anesthesia was induced by anesthesiologist.  Left upper extremity was prepped and draped in normal sterile orthopedic fashion.  Surgical pause was performed between surgeons, anesthesia, and operating room staff, and all were in agreement as to the patient, procedure, and site of procedure.  Tourniquet at the proximal aspect of the forearm had been inflated for the Bier block.  Incision was made at the volar aspect of the MP joint of the long finger and carried down into subcutaneous tissues by spreading technique.  Bipolar electrocautery was used to obtain hemostasis.  The A1 pulley was identified and sharply incised. The A2 pulley was preserved.  The neurovascular bundles on both radial and ulnar sides were protected.  The tendons were brought through the wound.  They were significantly adherent to each other with the tenosynovium.  This was debrided.  Samples of the tenosynovium was sent to Pathology for examination.  The tendons were brought through the wound again  and when the finger was straightened out, there was no triggering.  When coming into flexion, the PIP joint would click toward its end of flexion, but did not trigger when being extended.  Attention was turned to the index finger.  Again, an incision was made at the volar aspect of the MP joint, carried into subcutaneous tissues by spreading technique.  Bipolar electrocautery was used to obtain hemostasis.  The A1 pulley was identified and sharply incised.  The A2 pulley was preserved.  The radial and ulnar neurovascular bundles were protected.  Again, the tendons were adherent to each other.  They were brought through the  wound, and the tenosynovium debrided and some sent to Pathology for examination.  The finger was flexed down and slightly click when being extended.  Proximal 1-2 mm of the A2 pulley was vented. This resolved the clicking of the finger into extension.  There was a slight click at the endpoint of flexion.  The wounds were copiously irrigated with sterile saline.  They were closed with 4-0 nylon in horizontal mattress fashion.  It was then injected with 9 mL of 0.25% plain Marcaine to aid in postoperative analgesia.  They were dressed with sterile Xeroform, 4x4s, and wrapped with a Kling and Coban dressing lightly.  Tourniquet was deflated at 33 minutes.  Fingertips were pink with brisk capillary refill after deflation of tourniquet.  Operative drapes were broken down.  The patient was awoken from anesthesia safely. He was transferred back to stretcher and taken to PACU in stable condition.  I will see him back in the office in 1 week for postoperative followup.  I will give him Norco 5/325, 1-2 p.o. q.6 hours p.r.n. pain, dispensed #30.     Betha LoaKevin Shellyann Wandrey, MD     KK/MEDQ  D:  04/12/2015  T:  04/13/2015  Job:  [id number]

## 2015-06-26 ENCOUNTER — Encounter: Payer: Self-pay | Admitting: Pharmacist

## 2015-08-02 ENCOUNTER — Other Ambulatory Visit: Payer: Self-pay | Admitting: Ophthalmology

## 2015-08-26 ENCOUNTER — Other Ambulatory Visit: Payer: Self-pay | Admitting: Interventional Cardiology

## 2015-09-12 ENCOUNTER — Telehealth: Payer: Self-pay | Admitting: Interventional Cardiology

## 2015-09-12 NOTE — Telephone Encounter (Signed)
°  New message- Pharmacy called   Prior Auth for the Praluent 150 mg. Form has been faxed please include include all documentation. Please call if you have any questions

## 2015-09-13 NOTE — Telephone Encounter (Signed)
Please advise 

## 2015-09-14 ENCOUNTER — Telehealth: Payer: Self-pay | Admitting: Interventional Cardiology

## 2015-09-14 NOTE — Telephone Encounter (Signed)
PA has been completed.  Awaiting signature from Dr. Eldridge DaceVaranasi before it can be faxed back to [facility].

## 2015-09-14 NOTE — Telephone Encounter (Signed)
Prior Auth   Form Faxed on 10/19   For Prior Auth Pralulent 10 150 mg.   Case Id # [id number]

## 2015-09-14 NOTE — Telephone Encounter (Signed)
Received PA. Have filled out and waiting on signature from Dr. Eldridge DaceVaranasi.  Will fax to company as soon as completed.

## 2015-10-01 ENCOUNTER — Telehealth: Payer: Self-pay | Admitting: Interventional Cardiology

## 2015-10-01 NOTE — Telephone Encounter (Signed)
LMOM for pt.  He has not been seen in the Lipid Clinic since September 2015 and that was by Alfonse RasJeremy Smart, PharmD.  I have never had an office visit with the patient so I am unsure what bill he is referring to.  We did write a letter of medical necessity for his NMR that was done in May for patient in August.  I assume this is what he is calling about.  I cannot tell what was done with the letter after it was written and given to the nurse.  Will await pt's return phone call.

## 2015-10-01 NOTE — Telephone Encounter (Signed)
New Message     Pt calling in regards to previous issue with his insurance company stating they needed a letter of medical necessity in regards to pt's lipid clinic labs/appts. Pt states that Dr. Eldridge DaceVaranasi wrote a letter stating the reasoning and medical necessity for pt but it was sent to the pt instead of it being sent directly to his insurance company and it also just stated medical necessity for 1 particular visit. Pt states that he needs a new letter written stating that the pt will need to have this form of treatment for "x" amount of time, for "x" reasons, and for it to be mailed or faxed to his insurance company directly because they will not accept it from him. Pt is very upset because he states he previously dealt with this issue before for over a month between speaking to a nurse at our office, our billing department, the lab company, and his insurance company before he could get the last letter. Pt also states that because of the process taking so long, his last visits w/ Audrie LiaSally Earl in the lipid clinic have not been covered and [place] has sent them to collections. Please call pt back and advise.

## 2015-10-02 NOTE — Telephone Encounter (Signed)
Spoke with pt on 11/7.  He would like us to resend letter to [facility] for coverage of NMR.

## 2015-10-03 NOTE — Telephone Encounter (Signed)
Call to pt to discuss letter of Medical Necessity.  Phone number for [place] [employer] received [phone]).  Pt is unsure of where letter needs to be sent, however he states it does need to be sent directly from MD office to [facility]. This Clinical research associatewriter explained she will request a new letter and contact patient once letter has been sent to [facility].  Pt verbalized understanding. Jim Likeeri Wafaa Deemer MHA, RN, CCM

## 2015-11-14 ENCOUNTER — Encounter: Payer: Self-pay | Admitting: Physician Assistant

## 2015-11-14 ENCOUNTER — Ambulatory Visit (INDEPENDENT_AMBULATORY_CARE_PROVIDER_SITE_OTHER): Payer: BLUE CROSS/BLUE SHIELD | Admitting: Physician Assistant

## 2015-11-14 VITALS — BP 140/70 | HR 47 | Ht 70.0 in | Wt 193.0 lb

## 2015-11-14 DIAGNOSIS — I2510 Atherosclerotic heart disease of native coronary artery without angina pectoris: Secondary | ICD-10-CM | POA: Diagnosis not present

## 2015-11-14 DIAGNOSIS — E785 Hyperlipidemia, unspecified: Secondary | ICD-10-CM

## 2015-11-14 DIAGNOSIS — Z79899 Other long term (current) drug therapy: Secondary | ICD-10-CM | POA: Diagnosis not present

## 2015-11-14 DIAGNOSIS — I10 Essential (primary) hypertension: Secondary | ICD-10-CM

## 2015-11-14 DIAGNOSIS — R002 Palpitations: Secondary | ICD-10-CM | POA: Insufficient documentation

## 2015-11-14 DIAGNOSIS — I493 Ventricular premature depolarization: Secondary | ICD-10-CM

## 2015-11-14 MED ORDER — LISINOPRIL 5 MG PO TABS: 5.0000 mg | ORAL_TABLET | Freq: Every day | ORAL | Status: DC

## 2015-11-14 NOTE — Progress Notes (Signed)
Patient ID: [name], male   DOB: [name], 59 y.o.   Tyler: [id number]    Date:  11/14/2015   ID:  [name], [name], [name], [name]  PCP:  Joycelyn Rua, MD  Primary Cardiologist:  Eldridge Dace   Chief complain:    PVCs/palpitations   History of Present Illness: [name] is a 59 y.o. male  Was going to retire at the end of the year. He has a history of coronary artery disease, hypertension, hyperlipidemia, GERD, sleep apnea,  Psoriatic arthritis. Methotrexate and Humira did not work for him.   He takes a PC SK 9 inhibitor for hyperlipidemia. His last 2-D echocardiogram was March 2014 the ejection fraction of 60-65% with mild AI and MR.   His last stress test was October 2009   It was low risk.  The patient presents today with complaints of "feeling off" for the last month or so. He's been noting periodic but frequent PVCs.   He checks his pulse regularly and can feels a skipped beats. Also, his wife is a Publishing rights manager and also assessed him.  His normal resting heart rate is in the 40s to 50s. He's had some mild back pain which she relates to his psoriatic arthritis which seems to be worse with movement. He has not been exercising for the last 2 months he normally does quite a bit. Is also recently started drinking 2-3 liquor drinks per night.   Reports she was able to move a very heavy filing cabinet last weekend flight of stairs without any type of chest discomfort. He does state that he is chronically  short of breath which is not new.  He currently denies nausea, vomiting, fever, orthopnea, dizziness, PND, cough, congestion, abdominal pain, hematochezia, melena, lower extremity edema, claudication.  Wt Readings from Last 3 Encounters:  11/14/15 193 lb (87.544 kg)  04/12/15 198 lb (89.812 kg)  05/05/14 198 lb (89.812 kg)     Past Medical History  Diagnosis Date  . Coronary artery disease   . Hypertension   . Hyperlipemia   . GERD (gastroesophageal  reflux disease)   . Sleep apnea     snores-on apnea  . Snores   . Heart murmur   . Arthritis     psoriatic arthritis  . PONV (postoperative nausea and vomiting)     Current Outpatient Prescriptions  Medication Sig Dispense Refill  . Alirocumab (PRALUENT) 150 MG/ML SOPN Inject 150 mg into the skin every 14 (fourteen) days. 2 pen 11  . ANALPRAM-HC 2.5-1 % LOTN APPLY 1 APPLICATION A THIN FILM TO AFFECTED AREA AS NEEDED 3 TIMES DAILY AS NEEDED RECTALLY  5  . aspirin 81 MG tablet Take 81 mg by mouth daily.    . fexofenadine (ALLEGRA) 60 MG tablet Take 60 mg by mouth daily.    . Golimumab 50 MG/0.5ML SOAJ Inject into the skin.    Marland Kitchen lisinopril (PRINIVIL,ZESTRIL) 5 MG tablet Take 1 tablet (5 mg total) by mouth daily. 90 tablet 3  . montelukast (SINGULAIR) 10 MG tablet TAKE 1 TABLET BY MOUTH IN THE EVENIG  5  . PENNSAID 2 % SOLN APPLY 2 APPLICATIONS TO AFFECTED AREA TWICE A DAY TRANSDERMALLY FOR 30 DAYS  5  . ranitidine (ZANTAC) 150 MG tablet Take 150 mg by mouth as needed (HEARTBURN).     . rosuvastatin (CRESTOR) 10 MG tablet Take 1 tablet (10 mg total) by mouth once a week. 4 tablet 11  . valACYclovir (VALTREX) 1000 MG  tablet Take 1,000 mg by mouth as needed (FOR BREAKOUTS).   5  . ZOVIRAX 5 % USE AS DIRECTED AS NEEDED EXTERNALLY 30 DAYS  5   No current facility-administered medications for this visit.    Allergies:    Allergies  Allergen Reactions  . Crestor [Rosuvastatin]     Joint and tendon pain when taking daily or twice weekly  . Fish Oil     Dyspepsia   . Lipitor [Atorvastatin]     Severe joint and tendon pain  . Niacin And Related     Joint pain in hands  . Welchol [Colesevelam Hcl]     GI upset and constipation  . Zetia [Ezetimibe]     sedation    Social History:  The patient  reports that he has never smoked. He does not have any smokeless tobacco history on file. He reports that he drinks alcohol. He reports that he does not use illicit drugs.   Family history:     Family History  Problem Relation Age of Onset  . Colon cancer Father   . Heart attack Neg Hx   . Stroke Neg Hx   . Hypertension Father   . Hypertension Brother   . Cancer Father     COLON  . Cancer Mother     BRAIN  . Cancer Brother     ROS:  Please see the history of present illness.  All other systems reviewed and negative.   PHYSICAL EXAM: VS:  BP 140/70 mmHg  Pulse 47  Ht 5\' 10"  (1.778 m)  Wt 193 lb (87.544 kg)  BMI 27.69 kg/m2 Well nourished, well developed, in no acute distress HEENT: Pupils are equal round react to light accommodation extraocular movements are intact.  Neck: no JVDNo cervical lymphadenopathy. Cardiac: Regular rhythm, rate slow without murmurs rubs or gallops. Lungs:  clear to auscultation bilaterally, no wheezing, rhonchi or rales Abd: soft, nontender, positive bowel sounds all quadrants, no hepatosplenomegaly Ext: no lower extremity edema.  2+ radial and dorsalis pedis pulses. Skin: warm and dry Neuro:  Grossly normal  EKG:   Sinus bradycardia rate 47 bpm no PVCs no changes from prior EKG  ASSESSMENT AND PLAN:  Problem List Items Addressed This Visit    PVC's (premature ventricular contractions)   Relevant Medications   lisinopril (PRINIVIL,ZESTRIL) 5 MG tablet   Palpitations   Relevant Orders   EKG 12-Lead   Hyperlipidemia - Primary   Relevant Medications   lisinopril (PRINIVIL,ZESTRIL) 5 MG tablet   Other Relevant Orders   Lipid panel   Essential hypertension, benign   Relevant Medications   lisinopril (PRINIVIL,ZESTRIL) 5 MG tablet   Coronary atherosclerosis of native coronary artery   Relevant Medications   lisinopril (PRINIVIL,ZESTRIL) 5 MG tablet   Other Relevant Orders   EKG 12-Lead    Other Visit Diagnoses    Medication management        Relevant Orders    Lipid panel      Patient's been on a PCSK 9 inhibitor for at least a year now.   We'll recheck lipids tomorrow when he is fasting. Blood pressure is mildly elevated  no changes in medications. His main issue has been the PVCs.  I recommended that he eliminate his alcohol intake, which is new for him, and see if that makes a difference. The EKG and exam revealed no PVCs,   His resting heart rate is prohibitive for adding a beta blocker.   These under some stress regarding  his retirement. Unfortunately he didn't have this switched was whites insurance which is based on [employer]  And he will likely have to switch cardiologists.   I did offer a week's worth of heart monitoring to quantify his number PVCs elimination of alcohol does not help. Also recommended he resume exercise.

## 2015-11-14 NOTE — Patient Instructions (Signed)
Your physician recommends that you return for lab work at your earliest convenience - FASTING.  Wilburt FinlayBryan Hager, PA-C, recommends that you schedule a follow-up appointment in 6 months. You will receive a reminder letter in the mail two months in advance. If you don't receive a letter, please call our office to schedule the follow-up appointment.  If you need a refill on your cardiac medications before your next appointment, please call your pharmacy.

## 2015-11-15 LAB — LIPID PANEL
Cholesterol: 132 mg/dL (ref 125–200)
HDL: 47 mg/dL (ref >=40)
LDL Cholesterol: 60 mg/dL (ref <130)
Total CHOL/HDL Ratio: 2.8 Ratio (ref <=5.0)
Triglycerides: 127 mg/dL (ref <150)
VLDL: 25 mg/dL (ref <30)

## 2015-12-24 ENCOUNTER — Other Ambulatory Visit: Payer: Self-pay | Admitting: Pharmacist

## 2015-12-24 MED ORDER — ALIROCUMAB 150 MG/ML ~~LOC~~ SOPN: 150.0000 mg | PEN_INJECTOR | SUBCUTANEOUS | Status: DC

## 2016-01-02 ENCOUNTER — Other Ambulatory Visit: Payer: Self-pay | Admitting: Interventional Cardiology

## 2016-04-25 ENCOUNTER — Other Ambulatory Visit: Payer: Self-pay | Admitting: Interventional Cardiology

## 2016-04-25 MED ORDER — LISINOPRIL 5 MG PO TABS: 5.0000 mg | ORAL_TABLET | Freq: Every day | ORAL | Status: DC

## 2016-07-21 ENCOUNTER — Telehealth: Payer: Self-pay | Admitting: Interventional Cardiology

## 2016-07-21 NOTE — Telephone Encounter (Signed)
Maikou (rep) is advised that Dr Eldridge DaceVaranasi never prescribed Simponi for the pt as this medication is to treat rheumatoid arthritis, psoriatic arthritis, ankylosing spondylitis, or ulcerative colitis. She verbalized understanding and thanked me for calling them back so soon.

## 2016-07-21 NOTE — Telephone Encounter (Signed)
New message   Rep verbalized that pt is requesting a new perscription for the medication she stated it was previously written by Dr.Varanasi   For Sinponi it was previously denied by insurance and he wants to try again  Rep said to call pt for more details

## 2016-11-11 ENCOUNTER — Other Ambulatory Visit: Payer: Self-pay | Admitting: *Deleted

## 2016-11-11 MED ORDER — LISINOPRIL 5 MG PO TABS: 5.0000 mg | ORAL_TABLET | Freq: Every day | ORAL | 0 refills | Status: AC

## 2016-12-25 ENCOUNTER — Other Ambulatory Visit: Payer: Self-pay | Admitting: Pharmacist

## 2016-12-25 MED ORDER — ALIROCUMAB 150 MG/ML ~~LOC~~ SOPN: 150.0000 mg | PEN_INJECTOR | SUBCUTANEOUS | 3 refills | Status: AC

## 2023-06-17 ENCOUNTER — Ambulatory Visit (HOSPITAL_BASED_OUTPATIENT_CLINIC_OR_DEPARTMENT_OTHER): Payer: BLUE CROSS/BLUE SHIELD | Admitting: Physical Therapy

## 2023-07-14 ENCOUNTER — Ambulatory Visit (HOSPITAL_BASED_OUTPATIENT_CLINIC_OR_DEPARTMENT_OTHER): Payer: BLUE CROSS/BLUE SHIELD | Admitting: Physical Therapy

## 2023-08-23 NOTE — Therapy (Addendum)
 OUTPATIENT PHYSICAL THERAPY THORACOLUMBAR EVALUATION   Patient Name: [name] MRN: [id number] DOB:1956-10-22, 67 y.o., male Today's Date: 08/24/2023  END OF SESSION:  PT End of Session - 08/24/23 1448     Visit Number 1    Number of Visits 10    Date for PT Re-Evaluation 10/05/23    Authorization Type MCR A and B    PT Start Time 1322    PT Stop Time 1414    PT Time Calculation (min) 52 min    Activity Tolerance Patient tolerated treatment well    Behavior During Therapy WFL for tasks assessed/performed             Past Medical History:  Diagnosis Date   Arthritis    psoriatic arthritis   Coronary artery disease    GERD (gastroesophageal reflux disease)    Heart murmur    Hyperlipemia    Hypertension    PONV (postoperative nausea and vomiting)    Sleep apnea    snores-on apnea   Snores    Past Surgical History:  Procedure Laterality Date   APPENDECTOMY     CORONARY ARTERY BYPASS GRAFT  2006   x5   I & D EXTREMITY     rt shoulder   SCAR REVISION  05/25/2012   Procedure: SCAR REVISION;  Surgeon: Amelie Baize., MD;  Location: [facility];  Service: Orthopedics;  Laterality: Right;  deltoid diastasia repair   SHOULDER ARTHROSCOPY     rtx3   TENDON REPAIR Left 04/12/2015   Procedure: AND TENDON DEBRIDEMENT ;  Surgeon: Brunilda Capra, MD;  Location: [facility];  Service: Orthopedics;  Laterality: Left;   TRIGGER FINGER RELEASE  02/10/2012   Procedure: MINOR RELEASE TRIGGER FINGER/A-1 PULLEY;  Surgeon: Amelie Baize., MD;  Location: [facility];  Service: Orthopedics;  Laterality: Right;  right thumb   TRIGGER FINGER RELEASE     lt thumb   TRIGGER FINGER RELEASE  05/25/2012   Procedure: RELEASE TRIGGER FINGER/A-1 PULLEY;  Surgeon: Amelie Baize., MD;  Location: [facility];  Service: Orthopedics;  Laterality: Right;  right long    TRIGGER FINGER RELEASE Left 04/12/2015   Procedure: LEFT LONG   INDEX TRIGGER RELEASE ;  Surgeon: Brunilda Capra, MD;  Location: [facility];  Service: Orthopedics;  Laterality: Left;   Patient Active Problem List   Diagnosis Date Noted   PVC's (premature ventricular contractions) 11/14/2015   Palpitations 11/14/2015   Coronary atherosclerosis of native coronary artery 05/09/2014   Essential hypertension, benign 05/09/2014   Hyperlipidemia 04/20/2014     REFERRING PROVIDER: Phil Braun, MD  REFERRING DIAG: M53.3 Sacroiliac joint pain     M79.18  Myofascial pain  Rationale for Evaluation and Treatment: Rehabilitation  THERAPY DIAG:  Pain in right hip  Pain in left hip  ONSET DATE: chronic pain ; Exacerbation Fall of 2023  SUBJECTIVE:  SUBJECTIVE STATEMENT: Pt reports he has had pain for 8 years with no specific injury.  Pt states his pain has progressively worsened.  Pt has had different dx's over the years.  He had pain doing the leg press at the gym which "topped it off" last Fall.  His pain has significantly limited his activities.  Pt states most of his pain has been in posterior hips/glutes.  Pt has tried injections, prednisone, NSAID's, ice/heat, voltaren, and PT.  Pt uses the inversion table which helps a little.  Pt received PT late last year/early this year with a dx of proximal HS tendinopathy.  Pt states PT didn't help.  Pt had SI joint injection on 05/20/23 and pt reports it didn't help,   Pt had injections at L4-5 and L5-S1 on bilat sides on 08/10/23 before his trip in [place].  He states it helped him for about 2 days.  Pt states he had a MRI which found a 12 mm cyst pressing on his nerve.  PT order on 05/14/2023.  PT order indicated eval for aquatic therapy for low back pain concerns, may also consider dry needling, TENS if available.    Pt has significant pain with stairs and is unable to ambulate up an incline.  Pt is limited with standing duration and ambulation.  Pt tries to find somewhere to sit down after 5 mins of standing.  He has increased pain with ambulating 3/4 mile.  Pt is limited with tolerance to activities.  He states anything he does will cause him to be debilitated.  Pt is limited with household chores and yard work due to pain.  Pt has pain with holding grandchildren.  Pt unable to perform lateral movements and has increased pain with trying to play pickleball.  Pt not riding and working out at [employer].  Pt states he does struggle with agility.    PERTINENT HISTORY:  MRI findings of anterolisthesis at L5-S1 and a 12 mm cyst at L4-5  Bradycardia, CAD,OA,  CABG x5 2006, A-fib, R Shoulder arthroscopy x 2 (rotator cuff repair x 1), multiple trigger finger releases  Prostate CA with prostatectomy in 2018.  Had chemo and radiation afterwards.  Prostate adenocarcinoma, with metastatic disease;  small presacral lymph node consistent with metastatic disease to the lymph node.    PAIN:  NPRS:  2/10 current, 5-6 which can increase 9-10/10 pain with ADLs, 2/10 best  Location:  post hip and glutes, R lat hip, bilat ischial tuberosities; rarely in lumbar Type:  constant, sharp, extreme soreness Alleviating:  inversion table, NSAIDs and steroids, lying down  Aggravating:  ambulation, holding grandchildren, stairs, working in the yard, "normal activities"  PRECAUTIONS: Other: lumbar anterolisthesis, CABG x 5 in 2006, A-fib, Prostate CA   WEIGHT BEARING RESTRICTIONS: No  FALLS:  Has patient fallen in last 6 months? No  LIVING ENVIRONMENT: Lives with: lives with their spouse Lives in: 2 story home   OCCUPATION: Pt is retired  PLOF: Independent.  Pt was able to ride his bike outdoors 8-10 miles.  Pt was able to workout at [facility] 2-3 days per week.  PATIENT GOALS: "get rid of this pain".  Get back to sports  including playing pickleball, perform lateral movement, ride his bike outdoors   OBJECTIVE:   DIAGNOSTIC FINDINGS:  MRI on 08/02/2023: IMPRESSION: 1. L4-5: Bulging of the disc. Bilateral facet arthropathy with joint effusions. Synovial cyst projecting inward from the facet on the right measuring up to 12 mm in size. Spinal stenosis at  this level that could cause neural compression, particularly on the right because of the synovial cyst. 2. L5-S1: Chronic facet arthropathy with degenerative anterolisthesis of 4 mm. Bulging of the disc. Facet and ligamentous hypertrophy. Narrowing of the lateral recesses and foramina left more than right. Neural compression could occur at this level. The facet arthropathy would quite likely be painful.   PATIENT SURVEYS:  FOTO 31 with a goal of 54 at visit 14  SCREENING FOR RED FLAGS: Bowel or bladder incontinence: No Spinal tumors: No Cauda equina syndrome: No Compression fracture: No Abdominal aneurysm: No  COGNITION: Overall cognitive status: Within functional limits for tasks assessed     LUMBAR ROM:   AROM eval  Flexion WFL  Extension   Right lateral flexion WFL  Left lateral flexion WFL  Right rotation WFL  Left rotation WFL   (Blank rows = not tested) No pain with lumbar ROM testing   LOWER EXTREMITY MMT:    MMT Right eval Left eval  Hip flexion 5/5 5/5  Hip extension    Hip abduction 5/5 5/5  Hip adduction    Hip internal rotation    Hip external rotation 5/5 4+/5  Knee flexion 5/5 seated 5/5 seated  Knee extension 5/5 5/5  Ankle dorsiflexion    Ankle plantarflexion    Ankle inversion    Ankle eversion     (Blank rows = not tested)  LUMBAR SPECIAL TESTS:  SI joint tests:   Gapping:  negative Compression: negative Gaenslen's:  negative Sacral thrust:  negative  GAIT: Assistive device utilized: None Level of assistance: Complete Independence Comments:  Pt ambulated with a normalized gait without limping.    TODAY'S TREATMENT:                                                                                                                                See below for pt education PATIENT EDUCATION:  Education details: relevant anatomy, aquatic therapy process and properties, aquatic therapy rationale and benefits, POC, dx, and objective findings.  PT answered pt's questions. Person educated: Patient Education method: Explanation Education comprehension: verbalized understanding  HOME EXERCISE PROGRAM: Will give at a later date  ASSESSMENT:  CLINICAL IMPRESSION: Patient is a 67 y.o. male with dx's of SI joint pain and myofascial pain.  Pt reports having a hx of pain for 8 years and has tried multiple interventions for pain relief.  PT order indicated aquatic therapy.  Pt reports prior land based PT didn't help.  Pt is limited with tolerance to activities, standing duration and ambulation.  Pt has significant pain with stairs and is unable to ambulate up an incline.  Pt is limited with household chores and yard work due to pain.  Pt unable to perform lateral movements and has increased pain with trying to play pickleball.  Pt had good strength with LE testing except minor weakness in L hip ER.  Pt had negative SI tests today.  Pt may benefit from skilled PT services including aquatic therapy to address impairments and to improve overall function.    OBJECTIVE IMPAIRMENTS: decreased activity tolerance, decreased endurance, decreased strength, and pain.   ACTIVITY LIMITATIONS: carrying, standing, stairs, and locomotion level  PARTICIPATION LIMITATIONS: cleaning, shopping, community activity, and yard work  PERSONAL FACTORS: Time since onset of injury/illness/exacerbation and 3+ comorbidities: OA, R shoulder surgery, CABG x 5 in 2006, A-fib, Prostate CA  are also affecting patient's functional outcome.   REHAB POTENTIAL: Good  CLINICAL DECISION MAKING: Evolving/moderate complexity  EVALUATION  COMPLEXITY: Moderate   GOALS:  SHORT TERM GOALS: Target date: 09/14/2023   Pt will tolerate aquatic therapy without adverse effects for improved pain, tolerance to activity, strength, and function.   Baseline: Goal status: INITIAL  2.  Pt will report at least a 25% improvement in pain and sx's overall.   Baseline:  Goal status: INITIAL  3.  Pt will report worst pain with ADLs to be less than 8/10 Baseline:  Goal status: INITIAL  4.  Pt will be able to stand for at least 10 mins without significant pain and fatigue. Baseline:  Goal status: INITIAL    LONG TERM GOALS: Target date: 10/05/2023  Pt will report improved standing tolerance including being able to perform his IADLs and household chores without significant pain and difficulty.  Baseline:  Goal status: INITIAL  2.  Pt will report at least a 60% improvement in pain and sx's overall for improved performance of ADLs/IADLs and functional mobility.  Baseline:  Goal status: INITIAL  3.  Pt will be able to ambulate extended community distance without significant pain.  Baseline:  Goal status: INITIAL  4.  Pt will report improved ease and reduced pain with stairs.  Baseline:  Goal status: INITIAL  5.  Pt will be independent with aquatic HEP and appropriate land HEP as needed for improved tolerance to activity, pain, core strength, and function.  Baseline:  Goal status: INITIAL   PLAN:  PT FREQUENCY: 2x/week  PT DURATION: other: 5-6 weeks  PLANNED INTERVENTIONS: Therapeutic exercises, Therapeutic activity, Neuromuscular re-education, Balance training, Gait training, Patient/Family education, Self Care, Stair training, Aquatic Therapy, Taping, Manual therapy, and Re-evaluation.  PLAN FOR NEXT SESSION: Cont with aquatic therapy.  Pt has CA, avoid modalities   Trina Fujita III PT, DPT 08/25/23 8:06 AM  PHYSICAL THERAPY DISCHARGE SUMMARY  Visits from Start of Care: 1  Current functional level related to  goals / functional outcomes: Unable to assess due to pt not being present at discharge.   Remaining deficits: Unable to assess due to pt not being present at discharge.   Education / Equipment: See above   Pt was evaluated on 08/24/2023 and then cancelled his following appointments due to having back surgery.  Pt will be discharged from skilled PT due to a change in medical status.   Trina Fujita III PT, DPT 03/11/24 9:54 AM

## 2023-08-24 ENCOUNTER — Ambulatory Visit (HOSPITAL_BASED_OUTPATIENT_CLINIC_OR_DEPARTMENT_OTHER): Payer: Medicare Other | Attending: Pain Medicine | Admitting: Physical Therapy

## 2023-08-24 ENCOUNTER — Other Ambulatory Visit: Payer: Self-pay

## 2023-08-24 ENCOUNTER — Encounter (HOSPITAL_BASED_OUTPATIENT_CLINIC_OR_DEPARTMENT_OTHER): Payer: Self-pay | Admitting: Physical Therapy

## 2023-08-24 DIAGNOSIS — M25552 Pain in left hip: Secondary | ICD-10-CM

## 2023-08-24 DIAGNOSIS — M25551 Pain in right hip: Secondary | ICD-10-CM

## 2023-08-24 DIAGNOSIS — Z029 Encounter for administrative examinations, unspecified: Secondary | ICD-10-CM | POA: Insufficient documentation

## 2023-09-02 ENCOUNTER — Ambulatory Visit (HOSPITAL_BASED_OUTPATIENT_CLINIC_OR_DEPARTMENT_OTHER): Payer: Medicare Other | Admitting: Physical Therapy

## 2023-09-14 ENCOUNTER — Ambulatory Visit (HOSPITAL_BASED_OUTPATIENT_CLINIC_OR_DEPARTMENT_OTHER): Payer: Medicare Other | Admitting: Physical Therapy

## 2023-09-16 ENCOUNTER — Ambulatory Visit (HOSPITAL_BASED_OUTPATIENT_CLINIC_OR_DEPARTMENT_OTHER): Payer: Medicare Other | Admitting: Physical Therapy

## 2023-09-21 ENCOUNTER — Ambulatory Visit (HOSPITAL_BASED_OUTPATIENT_CLINIC_OR_DEPARTMENT_OTHER): Payer: Medicare Other | Admitting: Physical Therapy
